# Patient Record
Sex: Female | Born: 1980 | Race: Black or African American | Hispanic: No | Marital: Single | Smoking: Former smoker
Health system: Southern US, Community
[De-identification: ages and names within clinical notes are randomized; demographics above are authoritative.]

## PROBLEM LIST (undated history)

## (undated) ENCOUNTER — Inpatient Hospital Stay (HOSPITAL_COMMUNITY): Payer: Self-pay

## (undated) DIAGNOSIS — T4145XA Adverse effect of unspecified anesthetic, initial encounter: Secondary | ICD-10-CM

## (undated) DIAGNOSIS — F32A Depression, unspecified: Secondary | ICD-10-CM

## (undated) DIAGNOSIS — Z9889 Other specified postprocedural states: Secondary | ICD-10-CM

## (undated) DIAGNOSIS — IMO0002 Reserved for concepts with insufficient information to code with codable children: Secondary | ICD-10-CM

## (undated) DIAGNOSIS — K219 Gastro-esophageal reflux disease without esophagitis: Secondary | ICD-10-CM

## (undated) DIAGNOSIS — F329 Major depressive disorder, single episode, unspecified: Secondary | ICD-10-CM

## (undated) DIAGNOSIS — E669 Obesity, unspecified: Secondary | ICD-10-CM

## (undated) DIAGNOSIS — O139 Gestational [pregnancy-induced] hypertension without significant proteinuria, unspecified trimester: Secondary | ICD-10-CM

## (undated) DIAGNOSIS — R112 Nausea with vomiting, unspecified: Secondary | ICD-10-CM

## (undated) DIAGNOSIS — T8859XA Other complications of anesthesia, initial encounter: Secondary | ICD-10-CM

## (undated) DIAGNOSIS — A749 Chlamydial infection, unspecified: Secondary | ICD-10-CM

## (undated) HISTORY — DX: Chlamydial infection, unspecified: A74.9

## (undated) HISTORY — DX: Reserved for concepts with insufficient information to code with codable children: IMO0002

---

## 2001-11-23 ENCOUNTER — Inpatient Hospital Stay (HOSPITAL_COMMUNITY): Admission: AD | Admit: 2001-11-23 | Discharge: 2001-11-23 | Payer: Self-pay | Admitting: *Deleted

## 2001-12-07 ENCOUNTER — Ambulatory Visit (HOSPITAL_COMMUNITY): Admission: RE | Admit: 2001-12-07 | Discharge: 2001-12-07 | Payer: Self-pay | Admitting: *Deleted

## 2002-02-09 ENCOUNTER — Ambulatory Visit (HOSPITAL_COMMUNITY): Admission: RE | Admit: 2002-02-09 | Discharge: 2002-02-09 | Payer: Self-pay | Admitting: *Deleted

## 2002-03-14 ENCOUNTER — Ambulatory Visit (HOSPITAL_COMMUNITY): Admission: RE | Admit: 2002-03-14 | Discharge: 2002-03-14 | Payer: Self-pay | Admitting: *Deleted

## 2002-04-15 ENCOUNTER — Ambulatory Visit (HOSPITAL_COMMUNITY): Admission: RE | Admit: 2002-04-15 | Discharge: 2002-04-15 | Payer: Self-pay | Admitting: *Deleted

## 2002-05-01 ENCOUNTER — Inpatient Hospital Stay (HOSPITAL_COMMUNITY): Admission: AD | Admit: 2002-05-01 | Discharge: 2002-05-05 | Payer: Self-pay | Admitting: *Deleted

## 2002-05-11 ENCOUNTER — Inpatient Hospital Stay (HOSPITAL_COMMUNITY): Admission: AD | Admit: 2002-05-11 | Discharge: 2002-05-11 | Payer: Self-pay | Admitting: Obstetrics and Gynecology

## 2004-09-22 DIAGNOSIS — IMO0002 Reserved for concepts with insufficient information to code with codable children: Secondary | ICD-10-CM

## 2004-09-22 DIAGNOSIS — R87619 Unspecified abnormal cytological findings in specimens from cervix uteri: Secondary | ICD-10-CM

## 2004-09-22 HISTORY — DX: Unspecified abnormal cytological findings in specimens from cervix uteri: R87.619

## 2004-09-22 HISTORY — DX: Reserved for concepts with insufficient information to code with codable children: IMO0002

## 2005-02-06 ENCOUNTER — Encounter: Admission: RE | Admit: 2005-02-06 | Discharge: 2005-05-07 | Payer: Self-pay | Admitting: *Deleted

## 2007-06-04 ENCOUNTER — Inpatient Hospital Stay (HOSPITAL_COMMUNITY): Admission: AD | Admit: 2007-06-04 | Discharge: 2007-06-04 | Payer: Self-pay | Admitting: Obstetrics & Gynecology

## 2007-06-11 ENCOUNTER — Inpatient Hospital Stay (HOSPITAL_COMMUNITY): Admission: AD | Admit: 2007-06-11 | Discharge: 2007-06-11 | Payer: Self-pay | Admitting: Obstetrics and Gynecology

## 2007-07-30 ENCOUNTER — Ambulatory Visit (HOSPITAL_COMMUNITY): Admission: RE | Admit: 2007-07-30 | Discharge: 2007-07-30 | Payer: Self-pay | Admitting: Family Medicine

## 2007-09-15 ENCOUNTER — Inpatient Hospital Stay (HOSPITAL_COMMUNITY): Admission: AD | Admit: 2007-09-15 | Discharge: 2007-09-15 | Payer: Self-pay | Admitting: Obstetrics and Gynecology

## 2007-10-08 ENCOUNTER — Ambulatory Visit (HOSPITAL_COMMUNITY): Admission: RE | Admit: 2007-10-08 | Discharge: 2007-10-08 | Payer: Self-pay | Admitting: Obstetrics & Gynecology

## 2007-11-11 ENCOUNTER — Inpatient Hospital Stay (HOSPITAL_COMMUNITY): Admission: AD | Admit: 2007-11-11 | Discharge: 2007-11-11 | Payer: Self-pay | Admitting: Obstetrics & Gynecology

## 2007-11-11 ENCOUNTER — Ambulatory Visit: Payer: Self-pay | Admitting: Obstetrics & Gynecology

## 2007-12-21 ENCOUNTER — Inpatient Hospital Stay (HOSPITAL_COMMUNITY): Admission: AD | Admit: 2007-12-21 | Discharge: 2007-12-24 | Payer: Self-pay | Admitting: Obstetrics & Gynecology

## 2007-12-21 ENCOUNTER — Ambulatory Visit: Payer: Self-pay | Admitting: Obstetrics & Gynecology

## 2007-12-30 ENCOUNTER — Ambulatory Visit: Payer: Self-pay | Admitting: *Deleted

## 2008-01-24 ENCOUNTER — Ambulatory Visit: Payer: Self-pay | Admitting: *Deleted

## 2008-01-24 ENCOUNTER — Inpatient Hospital Stay (HOSPITAL_COMMUNITY): Admission: AD | Admit: 2008-01-24 | Discharge: 2008-01-24 | Payer: Self-pay | Admitting: Family Medicine

## 2008-08-14 ENCOUNTER — Emergency Department (HOSPITAL_COMMUNITY): Admission: EM | Admit: 2008-08-14 | Discharge: 2008-08-14 | Payer: Self-pay | Admitting: Emergency Medicine

## 2009-11-12 ENCOUNTER — Emergency Department (HOSPITAL_COMMUNITY): Admission: EM | Admit: 2009-11-12 | Discharge: 2009-11-12 | Payer: Self-pay | Admitting: Emergency Medicine

## 2009-12-18 ENCOUNTER — Emergency Department (HOSPITAL_COMMUNITY): Admission: EM | Admit: 2009-12-18 | Discharge: 2009-12-18 | Payer: Self-pay | Admitting: Emergency Medicine

## 2010-01-09 ENCOUNTER — Emergency Department (HOSPITAL_COMMUNITY): Admission: EM | Admit: 2010-01-09 | Discharge: 2010-01-09 | Payer: Self-pay | Admitting: Emergency Medicine

## 2010-12-10 LAB — RAPID STREP SCREEN (MED CTR MEBANE ONLY): Streptococcus, Group A Screen (Direct): POSITIVE — AB

## 2010-12-11 LAB — STREP A DNA PROBE: Group A Strep Probe: NEGATIVE

## 2011-01-08 ENCOUNTER — Emergency Department (HOSPITAL_COMMUNITY)
Admission: EM | Admit: 2011-01-08 | Discharge: 2011-01-08 | Disposition: A | Discharge status: Home / Self Care | Payer: Self-pay | Attending: Emergency Medicine | Admitting: Emergency Medicine

## 2011-01-08 DIAGNOSIS — R000 Tachycardia, unspecified: Secondary | ICD-10-CM | POA: Insufficient documentation

## 2011-01-08 DIAGNOSIS — R05 Cough: Secondary | ICD-10-CM | POA: Insufficient documentation

## 2011-01-08 DIAGNOSIS — R059 Cough, unspecified: Secondary | ICD-10-CM | POA: Insufficient documentation

## 2011-01-08 DIAGNOSIS — J029 Acute pharyngitis, unspecified: Secondary | ICD-10-CM | POA: Insufficient documentation

## 2011-01-08 LAB — RAPID STREP SCREEN (MED CTR MEBANE ONLY): Streptococcus, Group A Screen (Direct): NEGATIVE

## 2011-02-04 NOTE — Discharge Summary (Signed)
NAME:  Sarah Nash, Sarah Nash                 ACCOUNT NO.:  [id number]   MEDICAL RECORD NO.:  [id number]          PATIENT TYPE:  INP   LOCATION:  9121                          FACILITY:  [facility]   PHYSICIAN:  Phil D. Okey Dupre, M.D.     DATE OF BIRTH:  1981-04-28   DATE OF ADMISSION:  12/21/2007  DATE OF DISCHARGE:  12/24/2007                               DISCHARGE SUMMARY   DISCHARGE DIAGNOSIS:  Repeat low-transverse cesarean section of viable  fetus at 39 weeks.   DISCHARGE MEDICATIONS:  1. Percocet 1-2 tablets every 6 hours for pain as needed.  2. Colace 100 mg p.o. b.i.d. p.r.n. constipation.  3. Ibuprofen 600 mg p.o. q.6 hours p.r.n. pain.  4. Prenatal vitamins 1 tablet daily.  5. Iron sulfate 325 mg p.o. daily.   BRIEF HOSPITAL COURSE:  This is a 30 year old G2, P2, who delivered a  viable female infant at 56 weeks' gestation by repeat low-transverse  cesarean section on December 21, 2007.  The infant's Apgars were 9 and 9 at  1 and 5 minutes respectively.  The infant weighted 8 pounds 5 ounces.  The patient is blood type B positive with a negative antibody screen,  RPR nonreactive, rubella immune, HBsAg negative, HIV nonreactive, GBS  positive.  On December 22, 2007, her hemoglobin was 9.2 with hematocrit of  27.9.  There were no complications from the cesarean section, or before  or after the surgery.  The patient is discharged to home in stable  medical condition.  The infant is discharged with mother, she elected to  not have a circumcision of the infant at this time.  Routine post  cesarean section discharge instructions were given, and the [facility]      Romero Belling, MD      Phil D. Okey Dupre, M.D.  Electronically Signed    MO/MEDQ  D:  12/24/2007  T:  12/25/2007  Job:  [id number]   cc:   [facility]

## 2011-02-04 NOTE — Op Note (Signed)
NAME:  Sarah Nash, Sarah Nash NO.:  [id number]   MEDICAL RECORD NO.:  [id number]          PATIENT TYPE:  INP   LOCATION:  9121                          [facility]ACILITY:  [facility]   PHYSICIAN:  Lesly Dukes, M.D. DATE OF BIRTH:  05-Jul-1981   DATE OF PROCEDURE:  DATE OF DISCHARGE:                               OPERATIVE REPORT   PREOPERATIVE DIAGNOSES:  1. Intrauterine pregnancy at [redacted] weeks gestation.  2. History of previous cesarean section x1.  3. Group B Streptococcus positive.   POSTOPERATIVE DIAGNOSES:  1. Intrauterine pregnancy at [redacted] weeks gestation.  2. History of previous cesarean section x1.  3. Group B Streptococcus positive.   PROCEDURE:  Repeat low transverse cesarean section.   SURGEON:  Lesly Dukes, MD.   ASSISTANT:  Karlton Lemon, MD.   ANESTHESIA:  Spinal.   FINDINGS:  1. A viable infant female weighing 8 pounds 5 ounces with Apgar's 9 at      one minute and 9 at five minutes in a vertex presentation.  2. Clear amniotic fluid.  3. Normal female pelvic anatomy.  4. A few intraperitoneal adhesions.   ESTIMATED BLOOD LOSS:  700 ml.   DRAINS:  Foley with clear yellow urine.   COMPLICATIONS:  None immediate.   SPECIMENS:  1. Placenta to Labor and Delivery.  2. Cord blood to the Cord [facility].   INDICATION FOR PROCEDURE:  Sarah Nash is a 30 year old, gravida 2, para 1-  0-0-1, at [redacted] weeks gestation with a history of one previous cesarean  section.  She has been counseled about trial of labor after a cesarean  but elects for repeat cesarean section.   DESCRIPTION OF PROCEDURE:  The patient was taken to the operating room  and after obtaining adequate spinal anesthesia was prepped and draped in  the usual sterile manner in the supine position with a left lateral  uterine displacement.  After assuring adequate anesthesia, a  Pfannenstiel skin incision was made using the scalpel.  The incision was  carried down through the subcutaneous tissues using the  scalpel.  The  rectus fascia was nicked in the midline and the incision was extended  laterally in each direction using the Mayo scissors.  The rectus muscles  were dissected free of the fascia using both sharp and blunt dissection.  The rectus muscles were separated sharply.  The parietal peritoneum was  identified, grasped between two hemostats, elevated, and entered under  direct visualization with Metzenbaum scissors.  A few intraperitoneal  omental adhesions were broken down.  At this time, an Alexis wound  retractor was placed to provide good visualization of the  intraperitoneal cavity.  A bladder blade was placed and a reflection of  the visceral peritoneum superior to the bladder was identified,  elevated, and incised using the Metzenbaum scissors, and the incision  was extended laterally.  A bladder flap was created using blunt  dissection and retracted with the bladder blade.  A low transverse  uterine incision was made using the scalpel, and the incision was  extended laterally and superiorly using blunt dissection.  The amniotic  membranes were ruptured with pickups with teeth.  A hand was placed  within the uterine cavity and used to elevate and flex the head of the  infant, which was delivered without difficulty.  The infant was bulb  suctioned after delivery of the head.  A nuchal cord x1 was noted and  reduced after delivery of the head.  The body of the infant was then  delivered without difficulty.  The cord was doubly clamped and cut, and  the infant handed to the nursery team in attendance.  A specimen was not  collected for cord blood as this was to be done by the Cord [facility] staff.  The placenta was delivered with cord traction and uterine massage.  The  uterus was left in situ, and the endometrial cavity was wiped free of  any traces of membrane using wet laparotomy sponges.  The edges of the  uterine incision were grasped with ring clamps and the uterine incision   was closed in one layer of running locking #0 Vicryl stitch.  The  abdominal cavity and operative site were then irrigated with copious  amounts of normal saline.  The uterine incision was inspected once again  and found to have good hemostasis.  The rectus muscles were then  inspected, and small areas of bleeding were controlled using the Bovie  cautery.  The rectus fascia was reapproximated with one suture of #0  Vicryl.  Small areas of bleeding in the subcutaneous tissue were  controlled using the Bovie cautery.  The skin was reapproximated with  stainless steel skin staples.  Sponge, needle, and instrument counts  were correct x3.  The patient tolerated the procedure well and went to  the postanesthesia care unit in stable condition.      Karlton Lemon, MD  Electronically Signed     ______________________________  Lesly Dukes, M.D.    NS/MEDQ  D:  12/21/2007  T:  12/21/2007  Job:  [id number]

## 2011-02-07 NOTE — Discharge Summary (Signed)
NAME:  Sarah Nash, Sarah Nash                           ACCOUNT NO.:  [id number]   MEDICAL RECORD NO.:  [id number]                   PATIENT TYPE:  INP   LOCATION:  9124                                 [facility]ILITY[facility]  [facility]   PHYSICIAN:  Billey Gosling, MD                   DATE OF BIRTH:  [birth year]   DATE OF ADMISSION:  05/01/2002  DATE OF DISCHARGE:  05/05/2002                                 DISCHARGE SUMMARY   ADMISSION DIAGNOSES:  1. Intrauterine pregnancy at 40 weeks and two days.  2. Fetal heart rate reactive with mild variables.  3. Unknown group B streptococcus.   DISCHARGE DIAGNOSES:  1. Status post  low transverse cesarean section secondary to arrest of     labor.  2. Anemia.   PROCEDURE:  Low transverse cesarean section.   HOSPITAL COURSE:  A 30 year old  African-American female G-1, P-0 presented  to [facility] at 40 weeks and 2 days gestational age with complaints  that her water broke. The patient was having contractions at this time and  speculum exam revealed clear fluid and positive ferning. Digital cervical  exam revealed the patient was at 1 cm 80% effaced, -2 station and the infant  was vertex. The patient was admitted for Pitocin augmentation and was  started on Unasyn 3 grams IV q.6h. for group B streptococcus prophylaxis.   On hospital day #2, the patient had a temperature spike to 100.4 and was  started on gentamycin and clindamycin for suspected endometritis.   On day #3 of admission, hemoglobin was noted to go from 11.2 on admission to  7.9 on May 03, 2002. The patient was asymptomatic at the time and heart  rate normal at 82. The patient started on ferrous sulfate 325 mg b.i.d.   CBC repeated on 05-04-02 and hemoglobin remained stable at 7.9.   The patient  remained afebrile throughout the remainder of her  hospitalization and was discharged home on May 05, 2002 with Depo-Provera  150 mg IM times one prior to discharge and instructed to repeat that  in 12  weeks at [facility].   DISPOSITION:  Patient stable.   ACTIVITY:  Pelvic rest.   DIET:  Regular.   MEDICATIONS:  1. Percocet 5/325 mg q.4h. p.r.n. pain.  2. Ferrous sulfate 325 mg one p.o. b.i.d.  3. Colace 100 mg one to two p.o. q.h.s.   FOLLOW UP:  The patient  is to follow up at [facility] in six weeks and  also the patient  instructed to return to the MAU at [facility] in two  days for staple removal.                                               Marchelle Folks  Rob Bunting, MD    AS/MEDQ  D:  05/05/2002  T:  05/05/2002  Job:  [id number]

## 2011-02-07 NOTE — Op Note (Signed)
NAME:  Sarah Nash, Sarah Nash                           ACCOUNT NO.:  [id number]   MEDICAL RECORD NO.:  [id number]                   PATIENT TYPE:  INP   LOCATION:  9124                                 [facility]ILITY[facility]   PHYSICIAN:  Enid Cutter, M.D.                  DATE OF BIRTH:  [birth year]   DATE OF PROCEDURE:  05/02/2002  DATE OF DISCHARGE:                                 OPERATIVE REPORT   PREOPERATIVE DIAGNOSIS:  [age]-year-old G1, P0 at 41 weeks with arrest  of dilatation.   POSTOPERATIVE DIAGNOSIS:  [age]-year-old G1, P0 at 41 weeks with arrest  of dilatation.   PROCEDURE:  Primary low transverse cesarean section.   SURGEON:  Enid Cutter, M.D.   ASSISTANT:  Billey Gosling, M.D.   ANESTHESIA:  Epidural.   COMPLICATIONS:  None.   ESTIMATED BLOOD LOSS:  800 cc.   DISPOSITION:  To recovery room stable.   FINDINGS:  Viable female infant delivered at 12:06 a.m. with Apgars of 7 and  9, weight 7 pounds and 1 ounce.  Sponge, needle and instrument counts were  correct at the end of the procedure.   INDICATIONS FOR PROCEDURE:  The patient is a 30 year old gravida 1, para 1  at term who presented with spontaneous rupture of membranes early on the  morning of May 01, 2002.  She was begun on Pitocin augmentation and  maternal monitors.  The patient progressed to 5 cm with adequate labor,  however, failed to dilate beyond that.  Cesarean section was discussed with  the patient including the risks of bleeding, infection, injury to internal  organs, risks of transfusion, and emergent hysterectomy.  The patient  understands and wishes to proceed.   DESCRIPTION OF PROCEDURE:  The patient was taken to the operating room where  her general anesthesia was bolused.  She was prepped and draped in the usual  sterile fashion.  A Pfannenstiel incision was performed with the scalpel and  then carried down to the underlying fascia.  The subcutaneous tissues were  dissected and the  fascia was entered sharply.  The fascia was dissected  laterally with Mayo scissors.  The fascia was separated from the underlying  rectus muscle bellies with sharp and blunt dissection.  The rectus muscle  bellies were separated and the underlying peritoneum was entered sharply.  The peritoneum was dissected posteriorly and inferiorly.  The bladder blade  was placed and bladder flap was created with sharp and blunt dissection.  The uterus was scored with a scalpel, and the uterine incision was carried  down to the midline.  The surgeon's finger was then used to extend this  incision laterally and superiorly.  The infant's head was grasped and  delivered through the uterine incision.  Vicodin was applied to the scalp to  assist with delivery of the head.  The vacuum was removed, and the infant's  body was delivered without difficulty.  The cord was clamped and cut, and  the infant was handed off to the awaiting neonatal resuscitation team.  The  placenta was manually extracted and the uterus was externalized.  The uterus  was curetted with a dry lap sponge.  The uterine incision was repaired with  a running locking stitch of 0 chromic.  The uterine incision was hemostatic,  and the uterus was returned to the abdominal cavity.   The uterus was cleared of all clot and debris.  The uterine incision was  inspected again and noted to be hemostatic.  The fascia was then closed with  a running suture of 0 Vicryl.  The subcutaneous tissues were irrigated with  copious amounts of saline and the subcutaneous tissues were reapproximated  with staples.  The patient tolerated the procedure well.  Sponge, instrument  and needle counts were correct at the end of the procedure.                                               Enid Cutter, M.D.    EMH/MEDQ  D:  05/02/2002  T:  05/03/2002  Job:  [id number]

## 2011-06-16 LAB — CBC
HCT: 32.5 — ABNORMAL LOW
MCV: 79.9
Platelets: 273
RDW: 14.3

## 2011-06-16 LAB — RPR: RPR Ser Ql: NONREACTIVE

## 2011-06-17 LAB — CBC
MCHC: 33
MCV: 80.4
Platelets: 211
WBC: 10.9 — ABNORMAL HIGH

## 2011-06-18 LAB — CBC
Platelets: 286
WBC: 10

## 2011-06-18 LAB — POCT PREGNANCY, URINE: Preg Test, Ur: NEGATIVE

## 2011-06-24 LAB — RAPID STREP SCREEN (MED CTR MEBANE ONLY): Streptococcus, Group A Screen (Direct): NEGATIVE

## 2011-06-27 LAB — URINE CULTURE
Colony Count: NO GROWTH
Culture: NO GROWTH

## 2011-06-27 LAB — WET PREP, GENITAL

## 2011-06-27 LAB — URINALYSIS, ROUTINE W REFLEX MICROSCOPIC
Ketones, ur: NEGATIVE
Leukocytes, UA: NEGATIVE
Nitrite: NEGATIVE
Protein, ur: 30 — AB
Urobilinogen, UA: 0.2

## 2011-07-04 LAB — URINALYSIS, ROUTINE W REFLEX MICROSCOPIC
Bilirubin Urine: NEGATIVE
Hgb urine dipstick: NEGATIVE
Ketones, ur: NEGATIVE
Nitrite: NEGATIVE
Specific Gravity, Urine: 1.02

## 2011-07-04 LAB — POCT PREGNANCY, URINE: Preg Test, Ur: POSITIVE

## 2011-08-16 ENCOUNTER — Inpatient Hospital Stay (HOSPITAL_COMMUNITY)
Admission: AD | Admit: 2011-08-16 | Discharge: 2011-08-16 | Disposition: A | Discharge status: Home / Self Care | Payer: Self-pay | Source: Ambulatory Visit | Attending: Obstetrics and Gynecology | Admitting: Obstetrics and Gynecology

## 2011-08-16 DIAGNOSIS — R55 Syncope and collapse: Secondary | ICD-10-CM | POA: Insufficient documentation

## 2011-08-16 DIAGNOSIS — K589 Irritable bowel syndrome without diarrhea: Secondary | ICD-10-CM

## 2011-08-16 LAB — URINALYSIS, ROUTINE W REFLEX MICROSCOPIC
Glucose, UA: NEGATIVE mg/dL
Hgb urine dipstick: NEGATIVE
Ketones, ur: 15 mg/dL — AB
Protein, ur: NEGATIVE mg/dL
Urobilinogen, UA: 0.2 mg/dL (ref 0.0–1.0)

## 2011-08-16 LAB — POCT PREGNANCY, URINE: Preg Test, Ur: NEGATIVE

## 2011-08-16 LAB — GLUCOSE, CAPILLARY

## 2011-08-16 LAB — URINE MICROSCOPIC-ADD ON

## 2011-08-16 MED ORDER — NORGESTIMATE-ETH ESTRADIOL 0.25-35 MG-MCG PO TABS: 1.0000 | ORAL_TABLET | Freq: Every day | ORAL | Status: DC

## 2011-08-16 NOTE — Progress Notes (Signed)
Passed out yesterday was sitting at a table and fell into the floor, did not hit her head was witnessed by her friend, today feels tired and light headed, LMP 07/17/11, right knee pain has been there but worse today.

## 2011-12-22 DIAGNOSIS — A749 Chlamydial infection, unspecified: Secondary | ICD-10-CM

## 2011-12-22 HISTORY — DX: Chlamydial infection, unspecified: A74.9

## 2011-12-26 ENCOUNTER — Inpatient Hospital Stay (HOSPITAL_COMMUNITY)
Admission: AD | Admit: 2011-12-26 | Discharge: 2011-12-26 | Disposition: A | Discharge status: Home / Self Care | Payer: Medicaid Other | Source: Ambulatory Visit | Attending: Obstetrics and Gynecology | Admitting: Obstetrics and Gynecology

## 2011-12-26 ENCOUNTER — Encounter (HOSPITAL_COMMUNITY): Payer: Self-pay | Admitting: *Deleted

## 2011-12-26 ENCOUNTER — Inpatient Hospital Stay (HOSPITAL_COMMUNITY): Payer: Medicaid Other

## 2011-12-26 DIAGNOSIS — R109 Unspecified abdominal pain: Secondary | ICD-10-CM

## 2011-12-26 DIAGNOSIS — R197 Diarrhea, unspecified: Secondary | ICD-10-CM | POA: Insufficient documentation

## 2011-12-26 DIAGNOSIS — O26899 Other specified pregnancy related conditions, unspecified trimester: Secondary | ICD-10-CM

## 2011-12-26 DIAGNOSIS — O99891 Other specified diseases and conditions complicating pregnancy: Secondary | ICD-10-CM | POA: Insufficient documentation

## 2011-12-26 HISTORY — DX: Depression, unspecified: F32.A

## 2011-12-26 HISTORY — DX: Gestational (pregnancy-induced) hypertension without significant proteinuria, unspecified trimester: O13.9

## 2011-12-26 HISTORY — DX: Major depressive disorder, single episode, unspecified: F32.9

## 2011-12-26 LAB — URINALYSIS, ROUTINE W REFLEX MICROSCOPIC
Bilirubin Urine: NEGATIVE
Hgb urine dipstick: NEGATIVE
Ketones, ur: NEGATIVE mg/dL
Nitrite: NEGATIVE
Protein, ur: NEGATIVE mg/dL
Urobilinogen, UA: 1 mg/dL (ref 0.0–1.0)

## 2011-12-26 LAB — CBC
MCH: 24.2 pg — ABNORMAL LOW (ref 26.0–34.0)
MCHC: 30.1 g/dL (ref 30.0–36.0)
MCV: 80.4 fL (ref 78.0–100.0)
Platelets: 287 10*3/uL (ref 150–400)
RBC: 4.18 MIL/uL (ref 3.87–5.11)
RDW: 15.2 % (ref 11.5–15.5)

## 2011-12-26 LAB — WET PREP, GENITAL

## 2011-12-26 LAB — HCG, QUANTITATIVE, PREGNANCY: hCG, Beta Chain, Quant, S: 41894 m[IU]/mL — ABNORMAL HIGH (ref <5)

## 2011-12-26 LAB — POCT PREGNANCY, URINE: Preg Test, Ur: POSITIVE — AB

## 2011-12-26 NOTE — MAU Note (Signed)
Pamelia Hoit CNM in with pt

## 2011-12-26 NOTE — MAU Note (Signed)
Pt also reports onset diarrhea X 4 episodes today after awakening this am with cramping in there low abdomen. She states she has had cramping off and on for one week but this am was worse. She denies nausea or vomiting.

## 2011-12-26 NOTE — Progress Notes (Signed)
Pamelia Hoit NP in to discuss u/s results and d/c plan. Written and verbal d/c instructions given and understanding voiced

## 2011-12-26 NOTE — MAU Note (Signed)
Patient states she has had a positive home pregnancy test. Has been having lower abdominal cramping on and off for about one week, none since this am. Has had sharp shooting pain in her breasts for a couple of months. No bleeding or discharge.

## 2011-12-29 LAB — GC/CHLAMYDIA PROBE AMP, GENITAL: Chlamydia, DNA Probe: POSITIVE — AB

## 2012-02-18 ENCOUNTER — Inpatient Hospital Stay (HOSPITAL_COMMUNITY): Payer: Medicaid Other

## 2012-02-18 ENCOUNTER — Encounter (HOSPITAL_COMMUNITY): Payer: Self-pay

## 2012-02-18 ENCOUNTER — Inpatient Hospital Stay (HOSPITAL_COMMUNITY)
Admission: AD | Admit: 2012-02-18 | Discharge: 2012-02-18 | Disposition: A | Discharge status: Home / Self Care | Payer: Medicaid Other | Source: Ambulatory Visit | Attending: Obstetrics & Gynecology | Admitting: Obstetrics & Gynecology

## 2012-02-18 DIAGNOSIS — N390 Urinary tract infection, site not specified: Secondary | ICD-10-CM | POA: Insufficient documentation

## 2012-02-18 DIAGNOSIS — R1084 Generalized abdominal pain: Secondary | ICD-10-CM

## 2012-02-18 DIAGNOSIS — O2390 Unspecified genitourinary tract infection in pregnancy, unspecified trimester: Secondary | ICD-10-CM | POA: Insufficient documentation

## 2012-02-18 DIAGNOSIS — R109 Unspecified abdominal pain: Secondary | ICD-10-CM | POA: Insufficient documentation

## 2012-02-18 HISTORY — DX: Obesity, unspecified: E66.9

## 2012-02-18 LAB — URINALYSIS, ROUTINE W REFLEX MICROSCOPIC
Ketones, ur: NEGATIVE mg/dL
Nitrite: NEGATIVE
pH: 6 (ref 5.0–8.0)

## 2012-02-18 LAB — URINE MICROSCOPIC-ADD ON

## 2012-02-18 LAB — WET PREP, GENITAL

## 2012-02-18 MED ORDER — NITROFURANTOIN MACROCRYSTAL 100 MG PO CAPS: 100.0000 mg | ORAL_CAPSULE | Freq: Four times a day (QID) | ORAL | Status: AC

## 2012-02-18 MED ORDER — OXYCODONE-ACETAMINOPHEN 5-325 MG PO TABS: 1.0000 | ORAL_TABLET | ORAL | Status: AC | PRN

## 2012-02-18 MED ORDER — LACTATED RINGERS IV BOLUS (SEPSIS): 1000.0000 mL | Freq: Once | INTRAVENOUS | Status: DC

## 2012-02-18 NOTE — MAU Note (Signed)
Patient states she went for her first appointment at [facility] today and there had been mix up and they were unable to see her and referred her to MAU. Patient states she has been having lower abdominal pulling pressure off and on for a couple of weeks. Denies any bleeding, leaking or vaginal discharge. Reports having felt the baby move.

## 2012-02-18 NOTE — MAU Note (Signed)
Patient's OB appointment rescheduled for Wed. 6/5. Feels pulling and pressure in lower abdomen X 2 wks. No discharge or bleeding.

## 2012-02-18 NOTE — MAU Provider Note (Signed)
History     CSN: [id number]  Arrival date & time 02/18/12  1533   First Provider Initiated Contact with Patient 02/18/12 1840      Chief Complaint  Patient presents with  . Abdominal Pain    HPI  20 yr g3 p2002, prior Cesarean x 2.who has had 2 weeks of lowr abd pain below old cesarean scar. She has significant discomfort with standing x 2 weeks, no bleeding or srom.  She has had only slight discomfort with voiding, no frequency,   Past Medical History  Diagnosis Date  . Pregnancy induced hypertension   . Depression   . Obesity     Past Surgical History  Procedure Date  . Cesarean section     Family History  Problem Relation Age of Onset  . Anesthesia problems Neg Hx     History  Substance Use Topics  . Smoking status: Former Smoker    Quit date: 09/27/2011  . Smokeless tobacco: Never Used  . Alcohol Use: No    OB History    Grav Para Term Preterm Abortions TAB SAB Ect Mult Living   3 2 2       2       Review of Systems  Allergies  Review of patient's allergies indicates no known allergies.  Home Medications  No current outpatient prescriptions on file.  BP 133/92  Pulse 107  Temp(Src) 98.6 F (37 C) (Oral)  Resp 20  Ht 5' 10.5" (1.791 m)  Wt 200.853 kg (442 lb 12.8 oz)  BMI 62.64 kg/m2  SpO2 100%  LMP 09/23/2011  Physical Exam .Physical Examination: General appearance - oriented to person, place, and time, overweight, in mild to moderate distress, crying and only uncomfortable when standing, relieved by supine position  Mental status - alert, oriented to person, place, and time, normal mood, behavior, speech, dress, motor activity, and thought processes Neck - supple, no significant adenopathy Chest - clear to auscultation, no wheezes, rales or rhonchi, symmetric air entry, no tachypnea, retractions or cyanosis Heart - normal rate and regular rhythm Abdomen - soft, nontender, nondistended, no masses or organomegaly Exam limited by morbid  obesity. 2 well healed scars in pfannenstiel area. Discomfort is below this. Pelvic - VULVA: normal appearing vulva with no masses, tenderness or lesions, VAGINA: normal appearing vagina with normal color and discharge, no lesions, vaginal tenderness in the anterior area, , CERVIX: normal appearing cervix without discharge or lesions, difficult to assess due to anatomy, tender to palpation equal to anterior wall , will u/s cervix, exam chaperoned by RN,  Extremities - peripheral pulses normal, no pedal edema, no clubbing or cyanosis, Homan's sign negative bilaterally MAU Course  Procedures (including critical care time)  Labs Reviewed  URINALYSIS, ROUTINE W REFLEX MICROSCOPIC - Abnormal; Notable for the following:    APPearance HAZY (*)    Specific Gravity, Urine >1.030 (*)    Leukocytes, UA TRACE (*)    All other components within normal limits  URINE MICROSCOPIC-ADD ON - Abnormal; Notable for the following:    Squamous Epithelial / LPF FEW (*)    Bacteria, UA FEW (*)    All other components within normal limits   No results found.  U/s of cervix shows no funneling,and pt is nontender when supinel. No diagnosis found.    MDM  IMp : Stretching pain in area of prior cesareans.           Possible uti. Plan:  Abd binder prn when ambulating  Rx Macrobid 100 bid x 7 days           F/u urine c&s           F/u prn.

## 2012-02-19 LAB — GC/CHLAMYDIA PROBE AMP, GENITAL: GC Probe Amp, Genital: NEGATIVE

## 2012-02-25 ENCOUNTER — Other Ambulatory Visit (HOSPITAL_COMMUNITY): Payer: Self-pay | Admitting: Physician Assistant

## 2012-02-25 DIAGNOSIS — Z3689 Encounter for other specified antenatal screening: Secondary | ICD-10-CM

## 2012-02-25 LAB — OB RESULTS CONSOLE HGB/HCT, BLOOD
HCT: 29 %
Hemoglobin: 9 g/dL

## 2012-02-25 LAB — OB RESULTS CONSOLE HIV ANTIBODY (ROUTINE TESTING): HIV: NONREACTIVE

## 2012-02-25 LAB — OB RESULTS CONSOLE GC/CHLAMYDIA
Chlamydia: NEGATIVE
Gonorrhea: NEGATIVE

## 2012-02-25 LAB — OB RESULTS CONSOLE ANTIBODY SCREEN: Antibody Screen: NEGATIVE

## 2012-02-25 LAB — OB RESULTS CONSOLE ABO/RH: RH Type: POSITIVE

## 2012-02-25 LAB — OB RESULTS CONSOLE HEPATITIS B SURFACE ANTIGEN: Hepatitis B Surface Ag: NEGATIVE

## 2012-02-25 LAB — GLUCOSE TOLERANCE, 1 HOUR: Glucose, 1 Hour GTT: 89

## 2012-02-25 LAB — OB RESULTS CONSOLE RPR: RPR: NONREACTIVE

## 2012-02-25 LAB — CULTURE, OB URINE: Urine Culture, OB: NEGATIVE

## 2012-02-26 ENCOUNTER — Other Ambulatory Visit: Payer: Self-pay | Admitting: Physician Assistant

## 2012-02-26 DIAGNOSIS — R809 Proteinuria, unspecified: Secondary | ICD-10-CM

## 2012-03-01 ENCOUNTER — Encounter: Payer: Self-pay | Admitting: Family Medicine

## 2012-03-01 ENCOUNTER — Ambulatory Visit (INDEPENDENT_AMBULATORY_CARE_PROVIDER_SITE_OTHER): Payer: Medicaid Other | Admitting: Family Medicine

## 2012-03-01 VITALS — BP 139/87 | Temp 98.2°F | Wt >= 6400 oz

## 2012-03-01 DIAGNOSIS — E669 Obesity, unspecified: Secondary | ICD-10-CM

## 2012-03-01 DIAGNOSIS — O99210 Obesity complicating pregnancy, unspecified trimester: Secondary | ICD-10-CM

## 2012-03-01 DIAGNOSIS — O10019 Pre-existing essential hypertension complicating pregnancy, unspecified trimester: Secondary | ICD-10-CM | POA: Insufficient documentation

## 2012-03-01 DIAGNOSIS — O34219 Maternal care for unspecified type scar from previous cesarean delivery: Secondary | ICD-10-CM

## 2012-03-01 DIAGNOSIS — O0990 Supervision of high risk pregnancy, unspecified, unspecified trimester: Secondary | ICD-10-CM | POA: Insufficient documentation

## 2012-03-01 LAB — POCT URINALYSIS DIP (DEVICE)
Glucose, UA: NEGATIVE mg/dL
Nitrite: NEGATIVE
Protein, ur: 30 mg/dL — AB
Specific Gravity, Urine: 1.025 (ref 1.005–1.030)
Urobilinogen, UA: 0.2 mg/dL (ref 0.0–1.0)

## 2012-03-01 NOTE — Progress Notes (Signed)
Has pelvic pressure. Pulse: 94 Needs to see Nutrition and SW

## 2012-03-01 NOTE — Patient Instructions (Signed)

## 2012-03-01 NOTE — Progress Notes (Signed)
Has had baseline labs-need results--being faxed. Has anatomy scheduled.

## 2012-03-02 ENCOUNTER — Ambulatory Visit (HOSPITAL_COMMUNITY)
Admission: RE | Admit: 2012-03-02 | Discharge: 2012-03-02 | Disposition: A | Discharge status: Home / Self Care | Payer: Medicaid Other | Source: Ambulatory Visit | Attending: Physician Assistant | Admitting: Physician Assistant

## 2012-03-02 DIAGNOSIS — O99210 Obesity complicating pregnancy, unspecified trimester: Secondary | ICD-10-CM

## 2012-03-02 DIAGNOSIS — O34219 Maternal care for unspecified type scar from previous cesarean delivery: Secondary | ICD-10-CM | POA: Insufficient documentation

## 2012-03-02 DIAGNOSIS — O10019 Pre-existing essential hypertension complicating pregnancy, unspecified trimester: Secondary | ICD-10-CM

## 2012-03-02 DIAGNOSIS — E669 Obesity, unspecified: Secondary | ICD-10-CM | POA: Insufficient documentation

## 2012-03-02 DIAGNOSIS — Z3689 Encounter for other specified antenatal screening: Secondary | ICD-10-CM

## 2012-03-02 DIAGNOSIS — O358XX0 Maternal care for other (suspected) fetal abnormality and damage, not applicable or unspecified: Secondary | ICD-10-CM | POA: Insufficient documentation

## 2012-03-02 DIAGNOSIS — O09299 Supervision of pregnancy with other poor reproductive or obstetric history, unspecified trimester: Secondary | ICD-10-CM | POA: Insufficient documentation

## 2012-03-02 DIAGNOSIS — O0990 Supervision of high risk pregnancy, unspecified, unspecified trimester: Secondary | ICD-10-CM

## 2012-03-04 ENCOUNTER — Encounter: Payer: Self-pay | Admitting: *Deleted

## 2012-03-15 ENCOUNTER — Ambulatory Visit (INDEPENDENT_AMBULATORY_CARE_PROVIDER_SITE_OTHER): Payer: Medicaid Other | Admitting: Family Medicine

## 2012-03-15 VITALS — BP 144/92 | Temp 98.2°F | Wt >= 6400 oz

## 2012-03-15 DIAGNOSIS — O10019 Pre-existing essential hypertension complicating pregnancy, unspecified trimester: Secondary | ICD-10-CM

## 2012-03-15 NOTE — Patient Instructions (Signed)
Pregnancy - Second Trimester The second trimester of pregnancy (3 to 6 months) is a period of rapid growth for you and your baby. At the end of the sixth month, your baby is about 9 inches long and weighs 1 1/2 pounds. You will begin to feel the baby move between 18 and 20 weeks of the pregnancy. This is called quickening. Weight gain is faster. A clear fluid (colostrum) may leak out of your breasts. You may feel small contractions of the womb (uterus). This is known as false labor or Braxton-Hicks contractions. This is like a practice for labor when the baby is ready to be born. Usually, the problems with morning sickness have usually passed by the end of your first trimester. Some women develop small dark blotches (called cholasma, mask of pregnancy) on their face that usually goes away after the baby is born. Exposure to the sun makes the blotches worse. Acne may also develop in some pregnant women and pregnant women who have acne, may find that it goes away. PRENATAL EXAMS  Blood work may continue to be done during prenatal exams. These tests are done to check on your health and the probable health of your baby. Blood work is used to follow your blood levels (hemoglobin). Anemia (low hemoglobin) is common during pregnancy. Iron and vitamins are given to help prevent this. You will also be checked for diabetes between 24 and 28 weeks of the pregnancy. Some of the previous blood tests may be repeated.   The size of the uterus is measured during each visit. This is to make sure that the baby is continuing to grow properly according to the dates of the pregnancy.   Your blood pressure is checked every prenatal visit. This is to make sure you are not getting toxemia.   Your urine is checked to make sure you do not have an infection, diabetes or protein in the urine.   Your weight is checked often to make sure gains are happening at the suggested rate. This is to ensure that both you and your baby are  growing normally.   Sometimes, an ultrasound is performed to confirm the proper growth and development of the baby. This is a test which bounces harmless sound waves off the baby so your caregiver can more accurately determine due dates.  Sometimes, a specialized test is done on the amniotic fluid surrounding the baby. This test is called an amniocentesis. The amniotic fluid is obtained by sticking a needle into the belly (abdomen). This is done to check the chromosomes in instances where there is a concern about possible genetic problems with the baby. It is also sometimes done near the end of pregnancy if an early delivery is required. In this case, it is done to help make sure the baby's lungs are mature enough for the baby to live outside of the womb. CHANGES OCCURING IN THE SECOND TRIMESTER OF PREGNANCY Your body goes through many changes during pregnancy. They vary from person to person. Talk to your caregiver about changes you notice that you are concerned about.  During the second trimester, you will likely have an increase in your appetite. It is normal to have cravings for certain foods. This varies from person to person and pregnancy to pregnancy.   Your lower abdomen will begin to bulge.   You may have to urinate more often because the uterus and baby are pressing on your bladder. It is also common to get more bladder infections during pregnancy (  pain with urination). You can help this by drinking lots of fluids and emptying your bladder before and after intercourse.   You may begin to get stretch marks on your hips, abdomen, and breasts. These are normal changes in the body during pregnancy. There are no exercises or medications to take that prevent this change.   You may begin to develop swollen and bulging veins (varicose veins) in your legs. Wearing support hose, elevating your feet for 15 minutes, 3 to 4 times a day and limiting salt in your diet helps lessen the problem.    Heartburn may develop as the uterus grows and pushes up against the stomach. Antacids recommended by your caregiver helps with this problem. Also, eating smaller meals 4 to 5 times a day helps.   Constipation can be treated with a stool softener or adding bulk to your diet. Drinking lots of fluids, vegetables, fruits, and whole grains are helpful.   Exercising is also helpful. If you have been very active up until your pregnancy, most of these activities can be continued during your pregnancy. If you have been less active, it is helpful to start an exercise program such as walking.   Hemorrhoids (varicose veins in the rectum) may develop at the end of the second trimester. Warm sitz baths and hemorrhoid cream recommended by your caregiver helps hemorrhoid problems.   Backaches may develop during this time of your pregnancy. Avoid heavy lifting, wear low heal shoes and practice good posture to help with backache problems.   Some pregnant women develop tingling and numbness of their hand and fingers because of swelling and tightening of ligaments in the wrist (carpel tunnel syndrome). This goes away after the baby is born.   As your breasts enlarge, you may have to get a bigger bra. Get a comfortable, cotton, support bra. Do not get a nursing bra until the last month of the pregnancy if you will be nursing the baby.   You may get a dark line from your belly button to the pubic area called the linea nigra.   You may develop rosy cheeks because of increase blood flow to the face.   You may develop spider looking lines of the face, neck, arms and chest. These go away after the baby is born.  HOME CARE INSTRUCTIONS   It is extremely important to avoid all smoking, herbs, alcohol, and unprescribed drugs during your pregnancy. These chemicals affect the formation and growth of the baby. Avoid these chemicals throughout the pregnancy to ensure the delivery of a healthy infant.   Most of your home  care instructions are the same as suggested for the first trimester of your pregnancy. Keep your caregiver's appointments. Follow your caregiver's instructions regarding medication use, exercise and diet.   During pregnancy, you are providing food for you and your baby. Continue to eat regular, well-balanced meals. Choose foods such as meat, fish, milk and other low fat dairy products, vegetables, fruits, and whole-grain breads and cereals. Your caregiver will tell you of the ideal weight gain.   A physical sexual relationship may be continued up until near the end of pregnancy if there are no other problems. Problems could include early (premature) leaking of amniotic fluid from the membranes, vaginal bleeding, abdominal pain, or other medical or pregnancy problems.   Exercise regularly if there are no restrictions. Check with your caregiver if you are unsure of the safety of some of your exercises. The greatest weight gain will occur in the   last 2 trimesters of pregnancy. Exercise will help you:   Control your weight.   Get you in shape for labor and delivery.   Lose weight after you have the baby.   Wear a good support or jogging bra for breast tenderness during pregnancy. This may help if worn during sleep. Pads or tissues may be used in the bra if you are leaking colostrum.   Do not use hot tubs, steam rooms or saunas throughout the pregnancy.   Wear your seat belt at all times when driving. This protects you and your baby if you are in an accident.   Avoid raw meat, uncooked cheese, cat litter boxes and soil used by cats. These carry germs that can cause birth defects in the baby.   The second trimester is also a good time to visit your dentist for your dental health if this has not been done yet. Getting your teeth cleaned is OK. Use a soft toothbrush. Brush gently during pregnancy.   It is easier to loose urine during pregnancy. Tightening up and strengthening the pelvic muscles will  help with this problem. Practice stopping your urination while you are going to the bathroom. These are the same muscles you need to strengthen. It is also the muscles you would use as if you were trying to stop from passing gas. You can practice tightening these muscles up 10 times a set and repeating this about 3 times per day. Once you know what muscles to tighten up, do not perform these exercises during urination. It is more likely to contribute to an infection by backing up the urine.   Ask for help if you have financial, counseling or nutritional needs during pregnancy. Your caregiver will be able to offer counseling for these needs as well as refer you for other special needs.   Your skin may become oily. If so, wash your face with mild soap, use non-greasy moisturizer and oil or cream based makeup.  MEDICATIONS AND DRUG USE IN PREGNANCY  Take prenatal vitamins as directed. The vitamin should contain 1 milligram of folic acid. Keep all vitamins out of reach of children. Only a couple vitamins or tablets containing iron may be fatal to a baby or young child when ingested.   Avoid use of all medications, including herbs, over-the-counter medications, not prescribed or suggested by your caregiver. Only take over-the-counter or prescription medicines for pain, discomfort, or fever as directed by your caregiver. Do not use aspirin.   Let your caregiver also know about herbs you may be using.   Alcohol is related to a number of birth defects. This includes fetal alcohol syndrome. All alcohol, in any form, should be avoided completely. Smoking will cause low birth rate and premature babies.   Street or illegal drugs are very harmful to the baby. They are absolutely forbidden. A baby born to an addicted mother will be addicted at birth. The baby will go through the same withdrawal an adult does.  SEEK MEDICAL CARE IF:  You have any concerns or worries during your pregnancy. It is better to call with  your questions if you feel they cannot wait, rather than worry about them. SEEK IMMEDIATE MEDICAL CARE IF:   An unexplained oral temperature above 102 F (38.9 C) develops, or as your caregiver suggests.   You have leaking of fluid from the vagina (birth canal). If leaking membranes are suspected, take your temperature and tell your caregiver of this when you call.   There   is vaginal spotting, bleeding, or passing clots. Tell your caregiver of the amount and how many pads are used. Light spotting in pregnancy is common, especially following intercourse.   You develop a bad smelling vaginal discharge with a change in the color from clear to white.   You continue to feel sick to your stomach (nauseated) and have no relief from remedies suggested. You vomit blood or coffee ground-like materials.   You lose more than 2 pounds of weight or gain more than 2 pounds of weight over 1 week, or as suggested by your caregiver.   You notice swelling of your face, hands, feet, or legs.   You get exposed to German measles and have never had them.   You are exposed to fifth disease or chickenpox.   You develop belly (abdominal) pain. Round ligament discomfort is a common non-cancerous (benign) cause of abdominal pain in pregnancy. Your caregiver still must evaluate you.   You develop a bad headache that does not go away.   You develop fever, diarrhea, pain with urination, or shortness of breath.   You develop visual problems, blurry, or double vision.   You fall or are in a car accident or any kind of trauma.   There is mental or physical violence at home.  Document Released: 09/02/2001 Document Revised: 08/28/2011 Document Reviewed: 03/07/2009 [employer] Patient Information 2012 [employer]. Breastfeeding BENEFITS OF BREASTFEEDING For the baby  The first milk (colostrum) helps the baby's digestive system function better.   There are antibodies from the mother in the milk that help the  baby fight off infections.   The baby has a lower incidence of asthma, allergies, and SIDS (sudden infant death syndrome).   The nutrients in breast milk are better than formulas for the baby and helps the baby's brain grow better.   Babies who breastfeed have less gas, colic, and constipation.  For the mother  Breastfeeding helps develop a very special bond between mother and baby.   It is more convenient, always available at the correct temperature and cheaper than formula feeding.   It burns calories in the mother and helps with losing weight that was gained during pregnancy.   It makes the uterus contract back down to normal size faster and slows bleeding following delivery.   Breastfeeding mothers have a lower risk of developing breast cancer.  NURSE FREQUENTLY  A healthy, full-term baby may breastfeed as often as every hour or space his or her feedings to every 3 hours.   How often to nurse will vary from baby to baby. Watch your baby for signs of hunger, not the clock.   Nurse as often as the baby requests, or when you feel the need to reduce the fullness of your breasts.   Awaken the baby if it has been 3 to 4 hours since the last feeding.   Frequent feeding will help the mother make more milk and will prevent problems like sore nipples and engorgement of the breasts.  BABY'S POSITION AT THE BREAST  Whether lying down or sitting, be sure that the baby's tummy is facing your tummy.   Support the breast with 4 fingers underneath the breast and the thumb above. Make sure your fingers are well away from the nipple and baby's mouth.   Stroke the baby's lips and cheek closest to the breast gently with your finger or nipple.   When the baby's mouth is open wide enough, place all of your nipple and as much   of the dark area around the nipple as possible into your baby's mouth.   Pull the baby in close so the tip of the nose and the baby's cheeks touch the breast during the  feeding.  FEEDINGS  The length of each feeding varies from baby to baby and from feeding to feeding.   The baby must suck about 2 to 3 minutes for your milk to get to him or her. This is called a "let down." For this reason, allow the baby to feed on each breast as long as he or she wants. Your baby will end the feeding when he or she has received the right balance of nutrients.   To break the suction, put your finger into the corner of the baby's mouth and slide it between his or her gums before removing your breast from his or her mouth. This will help prevent sore nipples.  REDUCING BREAST ENGORGEMENT  In the first week after your baby is born, you may experience signs of breast engorgement. When breasts are engorged, they feel heavy, warm, full, and may be tender to the touch. You can reduce engorgement if you:   Nurse frequently, every 2 to 3 hours. Mothers who breastfeed early and often have fewer problems with engorgement.   Place light ice packs on your breasts between feedings. This reduces swelling. Wrap the ice packs in a lightweight towel to protect your skin.   Apply moist hot packs to your breast for 5 to 10 minutes before each feeding. This increases circulation and helps the milk flow.   Gently massage your breast before and during the feeding.   Make sure that the baby empties at least one breast at every feeding before switching sides.   Use a breast pump to empty the breasts if your baby is sleepy or not nursing well. You may also want to pump if you are returning to work or or you feel you are getting engorged.   Avoid bottle feeds, pacifiers or supplemental feedings of water or juice in place of breastfeeding.   Be sure the baby is latched on and positioned properly while breastfeeding.   Prevent fatigue, stress, and anemia.   Wear a supportive bra, avoiding underwire styles.   Eat a balanced diet with enough fluids.  If you follow these suggestions, your  engorgement should improve in 24 to 48 hours. If you are still experiencing difficulty, call your lactation consultant or caregiver. IS MY BABY GETTING ENOUGH MILK? Sometimes, mothers worry about whether their babies are getting enough milk. You can be assured that your baby is getting enough milk if:  The baby is actively sucking and you hear swallowing.   The baby nurses at least 8 to 12 times in a 24 hour time period. Nurse your baby until he or she unlatches or falls asleep at the first breast (at least 10 to 20 minutes), then offer the second side.   The baby is wetting 5 to 6 disposable diapers (6 to 8 cloth diapers) in a 24 hour period by 5 to 6 days of age.   The baby is having at least 2 to 3 stools every 24 hours for the first few months. Breast milk is all the food your baby needs. It is not necessary for your baby to have water or formula. In fact, to help your breasts make more milk, it is best not to give your baby supplemental feedings during the early weeks.   The stool should   be soft and yellow.   The baby should gain 4 to 7 ounces per week after he is 4 days old.  TAKE CARE OF YOURSELF Take care of your breasts by:  Bathing or showering daily.   Avoiding the use of soaps on your nipples.   Start feedings on your left breast at one feeding and on your right breast at the next feeding.   You will notice an increase in your milk supply 2 to 5 days after delivery. You may feel some discomfort from engorgement, which makes your breasts very firm and often tender. Engorgement "peaks" out within 24 to 48 hours. In the meantime, apply warm moist towels to your breasts for 5 to 10 minutes before feeding. Gentle massage and expression of some milk before feeding will soften your breasts, making it easier for your baby to latch on. Wear a well fitting nursing bra and air dry your nipples for 10 to 15 minutes after each feeding.   Only use cotton bra pads.   Only use pure lanolin on  your nipples after nursing. You do not need to wash it off before nursing.  Take care of yourself by:   Eating well-balanced meals and nutritious snacks.   Drinking milk, fruit juice, and water to satisfy your thirst (about 8 glasses a day).   Getting plenty of rest.   Increasing calcium in your diet (1200 mg a day).   Avoiding foods that you notice affect the baby in a bad way.  SEEK MEDICAL CARE IF:   You have any questions or difficulty with breastfeeding.   You need help.   You have a hard, red, sore area on your breast, accompanied by a fever of 100.5 F (38.1 C) or more.   Your baby is too sleepy to eat well or is having trouble sleeping.   Your baby is wetting less than 6 diapers per day, by 5 days of age.   Your baby's skin or white part of his or her eyes is more yellow than it was in the hospital.   You feel depressed.  Document Released: 09/08/2005 Document Revised: 08/28/2011 Document Reviewed: 04/23/2009 [employer] Patient Information 2012 [employer]. 

## 2012-03-15 NOTE — Progress Notes (Signed)
Doing well--reports baseline labs being done--will look for results.

## 2012-03-15 NOTE — Progress Notes (Signed)
Pulse- 102 Patient reports pelvic pressure  

## 2012-03-16 LAB — POCT URINALYSIS DIP (DEVICE)
Glucose, UA: NEGATIVE mg/dL
Hgb urine dipstick: NEGATIVE
Nitrite: NEGATIVE
Protein, ur: 100 mg/dL — AB
Urobilinogen, UA: 1 mg/dL (ref 0.0–1.0)
pH: 7 (ref 5.0–8.0)

## 2012-03-17 ENCOUNTER — Other Ambulatory Visit: Payer: Self-pay | Admitting: Family Medicine

## 2012-03-17 LAB — CULTURE, OB URINE

## 2012-03-17 MED ORDER — CEPHALEXIN 500 MG PO CAPS: 500.0000 mg | ORAL_CAPSULE | Freq: Three times a day (TID) | ORAL | Status: AC

## 2012-03-17 NOTE — Progress Notes (Signed)
For E-coli UTI

## 2012-03-17 NOTE — Progress Notes (Signed)
Called patient and left a message to call us back. Patient returned call. Informed her of uti and need to pick up medicine

## 2012-03-25 ENCOUNTER — Inpatient Hospital Stay (HOSPITAL_COMMUNITY)
Admission: AD | Admit: 2012-03-25 | Discharge: 2012-03-25 | Disposition: A | Discharge status: Home / Self Care | Payer: Medicaid Other | Source: Ambulatory Visit | Attending: Obstetrics and Gynecology | Admitting: Obstetrics and Gynecology

## 2012-03-25 ENCOUNTER — Encounter (HOSPITAL_COMMUNITY): Payer: Self-pay | Admitting: *Deleted

## 2012-03-25 DIAGNOSIS — O34219 Maternal care for unspecified type scar from previous cesarean delivery: Secondary | ICD-10-CM

## 2012-03-25 DIAGNOSIS — O99891 Other specified diseases and conditions complicating pregnancy: Secondary | ICD-10-CM | POA: Insufficient documentation

## 2012-03-25 DIAGNOSIS — O0990 Supervision of high risk pregnancy, unspecified, unspecified trimester: Secondary | ICD-10-CM

## 2012-03-25 DIAGNOSIS — O10019 Pre-existing essential hypertension complicating pregnancy, unspecified trimester: Secondary | ICD-10-CM

## 2012-03-25 DIAGNOSIS — R141 Gas pain: Secondary | ICD-10-CM

## 2012-03-25 DIAGNOSIS — O99210 Obesity complicating pregnancy, unspecified trimester: Secondary | ICD-10-CM

## 2012-03-25 LAB — URINALYSIS, ROUTINE W REFLEX MICROSCOPIC
Leukocytes, UA: NEGATIVE
Nitrite: NEGATIVE
Protein, ur: 30 mg/dL — AB
Urobilinogen, UA: 0.2 mg/dL (ref 0.0–1.0)

## 2012-03-25 LAB — URINE MICROSCOPIC-ADD ON

## 2012-03-25 NOTE — MAU Note (Signed)
PT SAYS SHE WOKE AT 0430-  ENTIRE ABD HURTING- WENT TO B-ROOM- HAD BM- NOW PAIN IS UNDER BREAST- ARRIVED VIA EMS.

## 2012-03-25 NOTE — MAU Provider Note (Signed)
  History     CSN: [id number]  Arrival date and time: 03/25/12 4098   First Provider Initiated Contact with Patient 03/25/12 [id number]      No chief complaint on file.  HPI This is a 31 y.o. female at [redacted]w[redacted]d who presented via EMS with c/o one episode of abdominal pain this morning at 0430.  States she called the hospital and they "told me to call an ambulance".  Got up when pain started and went to bathroom. Pain got worse for a minute, then after she had a BM, felt better. States feels almost totally better now. Has been on antibiotics for a UTI for a week. Denies N/V/D or fever. Denies bleeding or contractions.   OB History    Grav Para Term Preterm Abortions TAB SAB Ect Mult Living   3 2 2       2       Past Medical History  Diagnosis Date  . Pregnancy induced hypertension   . Depression   . Obesity   . Abnormal Pap smear 2006  . Chlamydia april 2013    Past Surgical History  Procedure Date  . Cesarean section     Family History  Problem Relation Age of Onset  . Anesthesia problems Neg Hx   . Hypertension Father   . Mental illness Father     History  Substance Use Topics  . Smoking status: Former Smoker    Quit date: 09/27/2011  . Smokeless tobacco: Never Used  . Alcohol Use: No    Allergies: No Known Allergies  Prescriptions prior to admission  Medication Sig Dispense Refill  . cephALEXin (KEFLEX) 500 MG capsule Take 1 capsule (500 mg total) by mouth 3 (three) times daily.  21 capsule  0  . Prenatal Vit-Fe Fumarate-FA (MULTIVITAMIN-PRENATAL) 27-0.8 MG TABS Take 1 tablet by mouth daily.      Marland Kitchen oxyCODONE-acetaminophen (PERCOCET) 5-325 MG per tablet Take 1 tablet by mouth every 4 (four) hours as needed.        ROS As listed in HPI.  Physical Exam   Blood pressure 136/90, pulse 93, temperature 98.5 F (36.9 C), temperature source Oral, resp. rate 20, height 5\' 11"  (1.803 m), weight 435 lb 6 oz (197.485 kg), last menstrual period 09/23/2011.  Physical Exam    Constitutional: She is oriented to person, place, and time. She appears well-developed and well-nourished.  HENT:  Head: Normocephalic.  Cardiovascular: Normal rate.   Respiratory: Effort normal.  GI: Soft. She exhibits no distension and no mass. There is no tenderness. There is no rebound and no guarding.  Musculoskeletal: Normal range of motion.  Neurological: She is alert and oriented to person, place, and time.  Skin: Skin is warm and dry.  Psychiatric: She has a normal mood and affect.    MAU Course  Procedures   Assessment and Plan  A:  SIUP at 108w2d       Abdominal pain episode x 1, now resolved   P:  Will recheck Urinalysis       Then will discharge home. Declines GI cocktail now.        Pt states she is afraid to go home because it might happen again. Reassured it is probably gas pain and recommended she take probiotics and/or Activia.        [facility] 03/25/2012, 7:45 AM

## 2012-03-28 NOTE — MAU Provider Note (Signed)
Attestation of Attending Supervision of Advanced Practitioner: Evaluation and management procedures were performed by the PA/NP/CNM/OB Fellow under my supervision/collaboration. Chart reviewed and agree with management and plan.  Rashell Shambaugh V 03/28/2012 11:18 AM

## 2012-03-29 ENCOUNTER — Encounter: Payer: Medicaid Other | Admitting: Physician Assistant

## 2012-04-05 ENCOUNTER — Ambulatory Visit (INDEPENDENT_AMBULATORY_CARE_PROVIDER_SITE_OTHER): Payer: Medicaid Other | Admitting: Physician Assistant

## 2012-04-05 VITALS — BP 140/85 | Temp 98.8°F | Wt >= 6400 oz

## 2012-04-05 DIAGNOSIS — O99210 Obesity complicating pregnancy, unspecified trimester: Secondary | ICD-10-CM

## 2012-04-05 DIAGNOSIS — O10019 Pre-existing essential hypertension complicating pregnancy, unspecified trimester: Secondary | ICD-10-CM

## 2012-04-05 DIAGNOSIS — O0990 Supervision of high risk pregnancy, unspecified, unspecified trimester: Secondary | ICD-10-CM

## 2012-04-05 LAB — POCT URINALYSIS DIP (DEVICE)
Hgb urine dipstick: NEGATIVE
Nitrite: NEGATIVE
Protein, ur: 30 mg/dL — AB
Urobilinogen, UA: 1 mg/dL (ref 0.0–1.0)
pH: 6.5 (ref 5.0–8.0)

## 2012-04-05 LAB — COMPREHENSIVE METABOLIC PANEL
Albumin: 3.1 g/dL — ABNORMAL LOW (ref 3.5–5.2)
Alkaline Phosphatase: 65 U/L (ref 39–117)
BUN: 5 mg/dL — ABNORMAL LOW (ref 6–23)
Glucose, Bld: 112 mg/dL — ABNORMAL HIGH (ref 70–99)
Potassium: 3.7 mEq/L (ref 3.5–5.3)
Total Bilirubin: 0.2 mg/dL — ABNORMAL LOW (ref 0.3–1.2)

## 2012-04-05 NOTE — Progress Notes (Signed)
+  FM no pre-x s/s. Growth [place] ordered. Unable to locate BL labs. Will recheck cbc, cmet, pro/creat ratio today. Pre-x precautions reviewed. 1 Glucola today.

## 2012-04-05 NOTE — Patient Instructions (Signed)

## 2012-04-05 NOTE — Progress Notes (Signed)
Pulse 93 1hr gtt today due at 1150

## 2012-04-05 NOTE — Progress Notes (Signed)
U/S scheduled April 13, 2012 at 315pm.

## 2012-04-06 LAB — PROTEIN / CREATININE RATIO, URINE
Protein Creatinine Ratio: 0.07 (ref <0.15)
Total Protein, Urine: 19 mg/dL

## 2012-04-13 ENCOUNTER — Ambulatory Visit (HOSPITAL_COMMUNITY)
Admission: RE | Admit: 2012-04-13 | Discharge: 2012-04-13 | Disposition: A | Discharge status: Home / Self Care | Payer: Medicaid Other | Source: Ambulatory Visit | Attending: Physician Assistant | Admitting: Physician Assistant

## 2012-04-13 DIAGNOSIS — O0990 Supervision of high risk pregnancy, unspecified, unspecified trimester: Secondary | ICD-10-CM

## 2012-04-13 DIAGNOSIS — O10019 Pre-existing essential hypertension complicating pregnancy, unspecified trimester: Secondary | ICD-10-CM

## 2012-04-13 DIAGNOSIS — Z363 Encounter for antenatal screening for malformations: Secondary | ICD-10-CM | POA: Insufficient documentation

## 2012-04-13 DIAGNOSIS — O358XX0 Maternal care for other (suspected) fetal abnormality and damage, not applicable or unspecified: Secondary | ICD-10-CM | POA: Insufficient documentation

## 2012-04-13 DIAGNOSIS — O99210 Obesity complicating pregnancy, unspecified trimester: Secondary | ICD-10-CM

## 2012-04-13 DIAGNOSIS — O34219 Maternal care for unspecified type scar from previous cesarean delivery: Secondary | ICD-10-CM | POA: Insufficient documentation

## 2012-04-13 DIAGNOSIS — O139 Gestational [pregnancy-induced] hypertension without significant proteinuria, unspecified trimester: Secondary | ICD-10-CM | POA: Insufficient documentation

## 2012-04-13 DIAGNOSIS — Z1389 Encounter for screening for other disorder: Secondary | ICD-10-CM | POA: Insufficient documentation

## 2012-04-19 ENCOUNTER — Ambulatory Visit (INDEPENDENT_AMBULATORY_CARE_PROVIDER_SITE_OTHER): Payer: Medicaid Other | Admitting: Obstetrics and Gynecology

## 2012-04-19 ENCOUNTER — Encounter: Payer: Self-pay | Admitting: Physician Assistant

## 2012-04-19 VITALS — BP 143/88 | Temp 98.1°F | Wt >= 6400 oz

## 2012-04-19 DIAGNOSIS — E669 Obesity, unspecified: Secondary | ICD-10-CM

## 2012-04-19 DIAGNOSIS — O10019 Pre-existing essential hypertension complicating pregnancy, unspecified trimester: Secondary | ICD-10-CM

## 2012-04-19 DIAGNOSIS — O99210 Obesity complicating pregnancy, unspecified trimester: Secondary | ICD-10-CM

## 2012-04-19 DIAGNOSIS — O0990 Supervision of high risk pregnancy, unspecified, unspecified trimester: Secondary | ICD-10-CM

## 2012-04-19 DIAGNOSIS — O34219 Maternal care for unspecified type scar from previous cesarean delivery: Secondary | ICD-10-CM

## 2012-04-19 LAB — POCT URINALYSIS DIP (DEVICE)
Ketones, ur: NEGATIVE mg/dL
Protein, ur: 30 mg/dL — AB

## 2012-04-19 NOTE — Progress Notes (Signed)
Patient doing well without complaints. FM/PTL precautions reviewed. Patient undecided on birth control- OCP vs Mirena

## 2012-04-19 NOTE — Progress Notes (Signed)
Pulse

## 2012-05-03 ENCOUNTER — Encounter: Payer: Medicaid Other | Admitting: Advanced Practice Midwife

## 2012-05-10 ENCOUNTER — Ambulatory Visit (INDEPENDENT_AMBULATORY_CARE_PROVIDER_SITE_OTHER): Payer: Medicaid Other | Admitting: Obstetrics & Gynecology

## 2012-05-10 VITALS — BP 141/92 | Temp 98.0°F | Wt >= 6400 oz

## 2012-05-10 DIAGNOSIS — O0990 Supervision of high risk pregnancy, unspecified, unspecified trimester: Secondary | ICD-10-CM

## 2012-05-10 DIAGNOSIS — O10019 Pre-existing essential hypertension complicating pregnancy, unspecified trimester: Secondary | ICD-10-CM

## 2012-05-10 LAB — POCT URINALYSIS DIP (DEVICE)
Glucose, UA: NEGATIVE mg/dL
Nitrite: NEGATIVE
Specific Gravity, Urine: 1.025 (ref 1.005–1.030)
Urobilinogen, UA: 1 mg/dL (ref 0.0–1.0)

## 2012-05-10 NOTE — Progress Notes (Signed)
Will start NST twice weekly. Lots of pelvic pressure. Schedule f/u US at 34 weeks.

## 2012-05-10 NOTE — Progress Notes (Signed)
U/S scheduled 05/17/12 at 3 pm.

## 2012-05-10 NOTE — Progress Notes (Signed)
Pulse 86.  2ndBP: 127/82 C/o increasing pelvic pain; pressure in vagina- states feel like a knot in vagina when ambulating. Vaginal d/c stated as thin creamy white; no odor, no itch.

## 2012-05-10 NOTE — Patient Instructions (Signed)

## 2012-05-13 ENCOUNTER — Other Ambulatory Visit: Payer: Medicaid Other

## 2012-05-17 ENCOUNTER — Other Ambulatory Visit: Payer: Medicaid Other

## 2012-05-17 ENCOUNTER — Other Ambulatory Visit: Payer: Self-pay | Admitting: Obstetrics & Gynecology

## 2012-05-17 ENCOUNTER — Encounter: Payer: Self-pay | Admitting: Obstetrics and Gynecology

## 2012-05-17 ENCOUNTER — Ambulatory Visit (HOSPITAL_COMMUNITY): Payer: Medicaid Other

## 2012-05-17 ENCOUNTER — Ambulatory Visit (HOSPITAL_COMMUNITY)
Admission: RE | Admit: 2012-05-17 | Discharge: 2012-05-17 | Disposition: A | Discharge status: Home / Self Care | Payer: Medicaid Other | Source: Ambulatory Visit | Attending: Obstetrics & Gynecology | Admitting: Obstetrics & Gynecology

## 2012-05-17 DIAGNOSIS — O10019 Pre-existing essential hypertension complicating pregnancy, unspecified trimester: Secondary | ICD-10-CM

## 2012-05-17 DIAGNOSIS — E669 Obesity, unspecified: Secondary | ICD-10-CM | POA: Insufficient documentation

## 2012-05-17 DIAGNOSIS — O99210 Obesity complicating pregnancy, unspecified trimester: Secondary | ICD-10-CM | POA: Insufficient documentation

## 2012-05-17 DIAGNOSIS — O09299 Supervision of pregnancy with other poor reproductive or obstetric history, unspecified trimester: Secondary | ICD-10-CM | POA: Insufficient documentation

## 2012-05-17 DIAGNOSIS — O139 Gestational [pregnancy-induced] hypertension without significant proteinuria, unspecified trimester: Secondary | ICD-10-CM | POA: Insufficient documentation

## 2012-05-17 DIAGNOSIS — O34219 Maternal care for unspecified type scar from previous cesarean delivery: Secondary | ICD-10-CM | POA: Insufficient documentation

## 2012-05-27 ENCOUNTER — Telehealth: Payer: Self-pay | Admitting: *Deleted

## 2012-05-27 NOTE — Telephone Encounter (Signed)
Called pt and discussed that she needs to be having twice weekly NST's due to hypertension and has missed appts.  This is important for the health and well-being of her baby and herself.  I asked if pt could come in today by 4pm for NST and BP check. She stated that it is doubtful. Pt endorses good FM daily. Pt agreed to come in on 05/31/12 @ 1400 for NST/AFI. She also will keep appt on 9/12 for Ob f/u and NST as scheduled. Pt voiced understanding of plan of care and agreed.

## 2012-05-31 ENCOUNTER — Ambulatory Visit (INDEPENDENT_AMBULATORY_CARE_PROVIDER_SITE_OTHER): Payer: Medicaid Other | Admitting: *Deleted

## 2012-05-31 VITALS — BP 130/76 | Wt >= 6400 oz

## 2012-05-31 DIAGNOSIS — O10019 Pre-existing essential hypertension complicating pregnancy, unspecified trimester: Secondary | ICD-10-CM

## 2012-05-31 NOTE — Progress Notes (Signed)
P = 90  Pt denies H/A's or visual disturbances.   Pt does not have baseline 24 hr urine- will collect and bring to prenatal visit on 06/03/12.

## 2012-06-02 NOTE — Progress Notes (Signed)
NST reviewed and reactive.  

## 2012-06-03 ENCOUNTER — Ambulatory Visit (HOSPITAL_COMMUNITY)
Admission: RE | Admit: 2012-06-03 | Discharge: 2012-06-03 | Disposition: A | Discharge status: Home / Self Care | Payer: Medicaid Other | Source: Ambulatory Visit | Attending: Obstetrics & Gynecology | Admitting: Obstetrics & Gynecology

## 2012-06-03 ENCOUNTER — Ambulatory Visit (INDEPENDENT_AMBULATORY_CARE_PROVIDER_SITE_OTHER): Payer: Medicaid Other | Admitting: General Practice

## 2012-06-03 VITALS — BP 143/91 | Temp 98.6°F | Wt >= 6400 oz

## 2012-06-03 DIAGNOSIS — O139 Gestational [pregnancy-induced] hypertension without significant proteinuria, unspecified trimester: Secondary | ICD-10-CM | POA: Insufficient documentation

## 2012-06-03 DIAGNOSIS — O99210 Obesity complicating pregnancy, unspecified trimester: Secondary | ICD-10-CM | POA: Insufficient documentation

## 2012-06-03 DIAGNOSIS — E669 Obesity, unspecified: Secondary | ICD-10-CM

## 2012-06-03 DIAGNOSIS — O368390 Maternal care for abnormalities of the fetal heart rate or rhythm, unspecified trimester, not applicable or unspecified: Secondary | ICD-10-CM

## 2012-06-03 DIAGNOSIS — O09299 Supervision of pregnancy with other poor reproductive or obstetric history, unspecified trimester: Secondary | ICD-10-CM | POA: Insufficient documentation

## 2012-06-03 DIAGNOSIS — O34219 Maternal care for unspecified type scar from previous cesarean delivery: Secondary | ICD-10-CM | POA: Insufficient documentation

## 2012-06-03 DIAGNOSIS — O10019 Pre-existing essential hypertension complicating pregnancy, unspecified trimester: Secondary | ICD-10-CM

## 2012-06-03 NOTE — Progress Notes (Signed)
BPP 8/10, done because NST performed today was reviewed and was found to be nonreactive.  Continue recommended antenatal testing and prenatal care. Continue to watch BP.

## 2012-06-03 NOTE — Progress Notes (Signed)
Pulse: 90

## 2012-06-03 NOTE — Progress Notes (Signed)
Prenatal visit not completed due to non reactive NST, patient sent to ultrasound department for BPP- report given to Dr. Macon Large

## 2012-06-07 ENCOUNTER — Other Ambulatory Visit: Payer: Medicaid Other

## 2012-06-10 ENCOUNTER — Ambulatory Visit (INDEPENDENT_AMBULATORY_CARE_PROVIDER_SITE_OTHER): Payer: Medicaid Other | Admitting: Family Medicine

## 2012-06-10 ENCOUNTER — Encounter: Payer: Self-pay | Admitting: Family Medicine

## 2012-06-10 VITALS — BP 129/78 | Temp 97.8°F | Wt >= 6400 oz

## 2012-06-10 DIAGNOSIS — E669 Obesity, unspecified: Secondary | ICD-10-CM

## 2012-06-10 DIAGNOSIS — O0990 Supervision of high risk pregnancy, unspecified, unspecified trimester: Secondary | ICD-10-CM

## 2012-06-10 DIAGNOSIS — O10019 Pre-existing essential hypertension complicating pregnancy, unspecified trimester: Secondary | ICD-10-CM

## 2012-06-10 DIAGNOSIS — O99210 Obesity complicating pregnancy, unspecified trimester: Secondary | ICD-10-CM

## 2012-06-10 LAB — POCT URINALYSIS DIP (DEVICE)
Bilirubin Urine: NEGATIVE
Glucose, UA: NEGATIVE mg/dL
Hgb urine dipstick: NEGATIVE
Nitrite: NEGATIVE

## 2012-06-10 NOTE — Progress Notes (Signed)
P = 95 "bad" pelvic pain

## 2012-06-10 NOTE — Patient Instructions (Signed)
Breastfeeding BENEFITS OF BREASTFEEDING For the baby  The first milk (colostrum) helps the baby's digestive system function better.   There are antibodies from the mother in the milk that help the baby fight off infections.   The baby has a lower incidence of asthma, allergies, and SIDS (sudden infant death syndrome).   The nutrients in breast milk are better than formulas for the baby and helps the baby's brain grow better.   Babies who breastfeed have less gas, colic, and constipation.  For the mother  Breastfeeding helps develop a very special bond between mother and baby.   It is more convenient, always available at the correct temperature and cheaper than formula feeding.   It burns calories in the mother and helps with losing weight that was gained during pregnancy.   It makes the uterus contract back down to normal size faster and slows bleeding following delivery.   Breastfeeding mothers have a lower risk of developing breast cancer.  NURSE FREQUENTLY  A healthy, full-term baby may breastfeed as often as every hour or space his or her feedings to every 3 hours.   How often to nurse will vary from baby to baby. Watch your baby for signs of hunger, not the clock.   Nurse as often as the baby requests, or when you feel the need to reduce the fullness of your breasts.   Awaken the baby if it has been 3 to 4 hours since the last feeding.   Frequent feeding will help the mother make more milk and will prevent problems like sore nipples and engorgement of the breasts.  BABY'S POSITION AT THE BREAST  Whether lying down or sitting, be sure that the baby's tummy is facing your tummy.   Support the breast with 4 fingers underneath the breast and the thumb above. Make sure your fingers are well away from the nipple and baby's mouth.   Stroke the baby's lips and cheek closest to the breast gently with your finger or nipple.   When the baby's mouth is open wide enough, place all  of your nipple and as much of the dark area around the nipple as possible into your baby's mouth.   Pull the baby in close so the tip of the nose and the baby's cheeks touch the breast during the feeding.  FEEDINGS  The length of each feeding varies from baby to baby and from feeding to feeding.   The baby must suck about 2 to 3 minutes for your milk to get to him or her. This is called a "let down." For this reason, allow the baby to feed on each breast as long as he or she wants. Your baby will end the feeding when he or she has received the right balance of nutrients.   To break the suction, put your finger into the corner of the baby's mouth and slide it between his or her gums before removing your breast from his or her mouth. This will help prevent sore nipples.  REDUCING BREAST ENGORGEMENT  In the first week after your baby is born, you may experience signs of breast engorgement. When breasts are engorged, they feel heavy, warm, full, and may be tender to the touch. You can reduce engorgement if you:   Nurse frequently, every 2 to 3 hours. Mothers who breastfeed early and often have fewer problems with engorgement.   Place light ice packs on your breasts between feedings. This reduces swelling. Wrap the ice packs in a   lightweight towel to protect your skin.   Apply moist hot packs to your breast for 5 to 10 minutes before each feeding. This increases circulation and helps the milk flow.   Gently massage your breast before and during the feeding.   Make sure that the baby empties at least one breast at every feeding before switching sides.   Use a breast pump to empty the breasts if your baby is sleepy or not nursing well. You may also want to pump if you are returning to work or or you feel you are getting engorged.   Avoid bottle feeds, pacifiers or supplemental feedings of water or juice in place of breastfeeding.   Be sure the baby is latched on and positioned properly while  breastfeeding.   Prevent fatigue, stress, and anemia.   Wear a supportive bra, avoiding underwire styles.   Eat a balanced diet with enough fluids.  If you follow these suggestions, your engorgement should improve in 24 to 48 hours. If you are still experiencing difficulty, call your lactation consultant or caregiver. IS MY BABY GETTING ENOUGH MILK? Sometimes, mothers worry about whether their babies are getting enough milk. You can be assured that your baby is getting enough milk if:  The baby is actively sucking and you hear swallowing.   The baby nurses at least 8 to 12 times in a 24 hour time period. Nurse your baby until he or she unlatches or falls asleep at the first breast (at least 10 to 20 minutes), then offer the second side.   The baby is wetting 5 to 6 disposable diapers (6 to 8 cloth diapers) in a 24 hour period by 5 to 6 days of age.   The baby is having at least 2 to 3 stools every 24 hours for the first few months. Breast milk is all the food your baby needs. It is not necessary for your baby to have water or formula. In fact, to help your breasts make more milk, it is best not to give your baby supplemental feedings during the early weeks.   The stool should be soft and yellow.   The baby should gain 4 to 7 ounces per week after he is 4 days old.  TAKE CARE OF YOURSELF Take care of your breasts by:  Bathing or showering daily.   Avoiding the use of soaps on your nipples.   Start feedings on your left breast at one feeding and on your right breast at the next feeding.   You will notice an increase in your milk supply 2 to 5 days after delivery. You may feel some discomfort from engorgement, which makes your breasts very firm and often tender. Engorgement "peaks" out within 24 to 48 hours. In the meantime, apply warm moist towels to your breasts for 5 to 10 minutes before feeding. Gentle massage and expression of some milk before feeding will soften your breasts, making  it easier for your baby to latch on. Wear a well fitting nursing bra and air dry your nipples for 10 to 15 minutes after each feeding.   Only use cotton bra pads.   Only use pure lanolin on your nipples after nursing. You do not need to wash it off before nursing.  Take care of yourself by:   Eating well-balanced meals and nutritious snacks.   Drinking milk, fruit juice, and water to satisfy your thirst (about 8 glasses a day).   Getting plenty of rest.   Increasing calcium in   your diet (1200 mg a day).   Avoiding foods that you notice affect the baby in a bad way.  SEEK MEDICAL CARE IF:   You have any questions or difficulty with breastfeeding.   You need help.   You have a hard, red, sore area on your breast, accompanied by a fever of 100.5 F (38.1 C) or more.   Your baby is too sleepy to eat well or is having trouble sleeping.   Your baby is wetting less than 6 diapers per day, by 5 days of age.   Your baby's skin or white part of his or her eyes is more yellow than it was in the hospital.   You feel depressed.  Document Released: 09/08/2005 Document Revised: 08/28/2011 Document Reviewed: 04/23/2009 [employer] Patient Information 2012 [employer]. 

## 2012-06-10 NOTE — Progress Notes (Signed)
Cultures today NST reviewed and reactive. Schedule repeat C-S at 39 wks.  For Nexplanon. BP good today

## 2012-06-11 LAB — GC/CHLAMYDIA PROBE AMP, GENITAL
Chlamydia, DNA Probe: NEGATIVE
GC Probe Amp, Genital: NEGATIVE

## 2012-06-13 LAB — CULTURE, BETA STREP (GROUP B ONLY)

## 2012-06-14 ENCOUNTER — Encounter (HOSPITAL_COMMUNITY): Payer: Self-pay | Admitting: Pharmacist

## 2012-06-17 ENCOUNTER — Encounter (HOSPITAL_COMMUNITY)
Admission: RE | Admit: 2012-06-17 | Discharge: 2012-06-17 | Disposition: A | Discharge status: Home / Self Care | Payer: Medicaid Other | Source: Ambulatory Visit | Attending: Family Medicine | Admitting: Family Medicine

## 2012-06-17 ENCOUNTER — Encounter (HOSPITAL_COMMUNITY): Payer: Self-pay

## 2012-06-17 ENCOUNTER — Encounter: Payer: Self-pay | Admitting: Obstetrics & Gynecology

## 2012-06-17 ENCOUNTER — Other Ambulatory Visit: Payer: Medicaid Other

## 2012-06-17 VITALS — BP 133/84 | HR 85 | Ht 71.0 in | Wt >= 6400 oz

## 2012-06-17 DIAGNOSIS — O99210 Obesity complicating pregnancy, unspecified trimester: Secondary | ICD-10-CM

## 2012-06-17 DIAGNOSIS — O34219 Maternal care for unspecified type scar from previous cesarean delivery: Secondary | ICD-10-CM

## 2012-06-17 DIAGNOSIS — O0990 Supervision of high risk pregnancy, unspecified, unspecified trimester: Secondary | ICD-10-CM

## 2012-06-17 DIAGNOSIS — O10019 Pre-existing essential hypertension complicating pregnancy, unspecified trimester: Secondary | ICD-10-CM

## 2012-06-17 LAB — CBC
MCHC: 29.8 g/dL — ABNORMAL LOW (ref 30.0–36.0)
Platelets: 334 10*3/uL (ref 150–400)
RDW: 15 % (ref 11.5–15.5)

## 2012-06-17 LAB — TYPE AND SCREEN: ABO/RH(D): B POS

## 2012-06-17 NOTE — Patient Instructions (Addendum)
   Your procedure is scheduled ZO:[id number] October 1st  Enter through the Main Entrance of [facility] at: 6am Pick up the phone at the desk and dial [phone] and inform us of your arrival.  Please call this number if you have any problems the morning of surgery: [phone]  Remember: Do not eat or drink anything after midnight on Monday  Do not wear jewelry, make-up, or FINGER nail polish No metal in your hair or on your body. Do not wear lotions, powders, perfumes. You may wear deodorant.  Please use your CHG wash as directed prior to surgery.  Do not shave anywhere for at least 12 hours prior to first CHG shower.  Do not bring valuables to the hospital.  Leave suitcase in the car. After Surgery it may be brought to your room. For patients being admitted to the hospital, checkout time is 11:00am the day of discharge.

## 2012-06-18 LAB — RPR: RPR Ser Ql: NONREACTIVE

## 2012-06-22 ENCOUNTER — Encounter (HOSPITAL_COMMUNITY): Payer: Self-pay | Admitting: Anesthesiology

## 2012-06-22 ENCOUNTER — Encounter (HOSPITAL_COMMUNITY): Payer: Self-pay | Admitting: *Deleted

## 2012-06-22 ENCOUNTER — Inpatient Hospital Stay (HOSPITAL_COMMUNITY): Payer: Medicaid Other | Admitting: Anesthesiology

## 2012-06-22 ENCOUNTER — Inpatient Hospital Stay (HOSPITAL_COMMUNITY)
Admission: AD | Admit: 2012-06-22 | Discharge: 2012-06-24 | DRG: 766 | Disposition: A | Discharge status: Home / Self Care | Payer: Medicaid Other | Source: Ambulatory Visit | Attending: Family Medicine | Admitting: Family Medicine

## 2012-06-22 ENCOUNTER — Encounter (HOSPITAL_COMMUNITY)
Admission: AD | Disposition: A | Discharge status: Home / Self Care | Payer: Self-pay | Source: Ambulatory Visit | Attending: Family Medicine

## 2012-06-22 DIAGNOSIS — O99214 Obesity complicating childbirth: Secondary | ICD-10-CM

## 2012-06-22 DIAGNOSIS — E669 Obesity, unspecified: Secondary | ICD-10-CM | POA: Diagnosis present

## 2012-06-22 DIAGNOSIS — O99210 Obesity complicating pregnancy, unspecified trimester: Secondary | ICD-10-CM | POA: Diagnosis present

## 2012-06-22 DIAGNOSIS — O0990 Supervision of high risk pregnancy, unspecified, unspecified trimester: Secondary | ICD-10-CM

## 2012-06-22 DIAGNOSIS — O34219 Maternal care for unspecified type scar from previous cesarean delivery: Secondary | ICD-10-CM

## 2012-06-22 DIAGNOSIS — O99892 Other specified diseases and conditions complicating childbirth: Secondary | ICD-10-CM | POA: Diagnosis present

## 2012-06-22 DIAGNOSIS — R030 Elevated blood-pressure reading, without diagnosis of hypertension: Secondary | ICD-10-CM | POA: Diagnosis present

## 2012-06-22 DIAGNOSIS — O9989 Other specified diseases and conditions complicating pregnancy, childbirth and the puerperium: Secondary | ICD-10-CM

## 2012-06-22 DIAGNOSIS — O10019 Pre-existing essential hypertension complicating pregnancy, unspecified trimester: Secondary | ICD-10-CM | POA: Diagnosis present

## 2012-06-22 LAB — PREPARE RBC (CROSSMATCH)

## 2012-06-22 SURGERY — Surgical Case
Anesthesia: Spinal | Site: Abdomen | Wound class: Clean Contaminated

## 2012-06-22 MED ORDER — MENTHOL 3 MG MT LOZG: 1.0000 | LOZENGE | OROMUCOSAL | Status: DC | PRN

## 2012-06-22 MED ORDER — NALBUPHINE HCL 10 MG/ML IJ SOLN
5.0000 mg | INTRAMUSCULAR | Status: DC | PRN
  Administered 2012-06-22: 5 mg via SUBCUTANEOUS
  Filled 2012-06-22: qty 1

## 2012-06-22 MED ORDER — WITCH HAZEL-GLYCERIN EX PADS: 1.0000 "application " | MEDICATED_PAD | CUTANEOUS | Status: DC | PRN

## 2012-06-22 MED ORDER — KETOROLAC TROMETHAMINE 30 MG/ML IJ SOLN: 15.0000 mg | Freq: Once | INTRAMUSCULAR | Status: DC | PRN

## 2012-06-22 MED ORDER — ONDANSETRON HCL 4 MG/2ML IJ SOLN
INTRAMUSCULAR | Status: DC | PRN
  Administered 2012-06-22: 4 mg via INTRAVENOUS

## 2012-06-22 MED ORDER — LACTATED RINGERS IV SOLN
INTRAVENOUS | Status: DC
  Administered 2012-06-22: 09:00:00 via INTRAVENOUS
  Administered 2012-06-22: 125 mL/h via INTRAVENOUS

## 2012-06-22 MED ORDER — SCOPOLAMINE 1 MG/3DAYS TD PT72
1.0000 | MEDICATED_PATCH | Freq: Once | TRANSDERMAL | Status: DC
  Filled 2012-06-22: qty 1

## 2012-06-22 MED ORDER — FENTANYL CITRATE 0.05 MG/ML IJ SOLN
INTRAMUSCULAR | Status: DC | PRN
  Administered 2012-06-22 (×3): 50 ug via INTRAVENOUS
  Administered 2012-06-22: 25 ug via INTRAVENOUS

## 2012-06-22 MED ORDER — MEPERIDINE HCL 25 MG/ML IJ SOLN: 6.2500 mg | INTRAMUSCULAR | Status: DC | PRN

## 2012-06-22 MED ORDER — DIPHENHYDRAMINE HCL 25 MG PO CAPS: 25.0000 mg | ORAL_CAPSULE | Freq: Four times a day (QID) | ORAL | Status: DC | PRN

## 2012-06-22 MED ORDER — DIPHENHYDRAMINE HCL 50 MG/ML IJ SOLN: 12.5000 mg | INTRAMUSCULAR | Status: DC | PRN

## 2012-06-22 MED ORDER — BUPIVACAINE HCL (PF) 0.25 % IJ SOLN
INTRAMUSCULAR | Status: AC
  Filled 2012-06-22: qty 30

## 2012-06-22 MED ORDER — LANOLIN HYDROUS EX OINT: 1.0000 "application " | TOPICAL_OINTMENT | CUTANEOUS | Status: DC | PRN

## 2012-06-22 MED ORDER — ONDANSETRON HCL 4 MG/2ML IJ SOLN
INTRAMUSCULAR | Status: AC
  Filled 2012-06-22: qty 2

## 2012-06-22 MED ORDER — DIBUCAINE 1 % RE OINT: 1.0000 "application " | TOPICAL_OINTMENT | RECTAL | Status: DC | PRN

## 2012-06-22 MED ORDER — ONDANSETRON HCL 4 MG/2ML IJ SOLN: 4.0000 mg | INTRAMUSCULAR | Status: DC | PRN

## 2012-06-22 MED ORDER — NALBUPHINE SYRINGE 5 MG/0.5 ML
INJECTION | INTRAMUSCULAR | Status: AC
  Administered 2012-06-22: 5 mg via SUBCUTANEOUS
  Filled 2012-06-22: qty 0.5

## 2012-06-22 MED ORDER — SIMETHICONE 80 MG PO CHEW: 80.0000 mg | CHEWABLE_TABLET | ORAL | Status: DC | PRN

## 2012-06-22 MED ORDER — OXYTOCIN 40 UNITS IN LACTATED RINGERS INFUSION - SIMPLE MED: 62.5000 mL/h | INTRAVENOUS | Status: AC

## 2012-06-22 MED ORDER — SODIUM BICARBONATE 8.4 % IV SOLN
INTRAVENOUS | Status: DC | PRN
  Administered 2012-06-22: 3 mL via EPIDURAL

## 2012-06-22 MED ORDER — ACETAMINOPHEN 10 MG/ML IV SOLN
1000.0000 mg | Freq: Four times a day (QID) | INTRAVENOUS | Status: AC | PRN
  Filled 2012-06-22: qty 100

## 2012-06-22 MED ORDER — DIPHENHYDRAMINE HCL 25 MG PO CAPS: 25.0000 mg | ORAL_CAPSULE | ORAL | Status: DC | PRN

## 2012-06-22 MED ORDER — ZOLPIDEM TARTRATE 5 MG PO TABS: 5.0000 mg | ORAL_TABLET | Freq: Every evening | ORAL | Status: DC | PRN

## 2012-06-22 MED ORDER — SODIUM CHLORIDE 0.9 % IV SOLN
1.0000 ug/kg/h | INTRAVENOUS | Status: DC | PRN
  Filled 2012-06-22: qty 2.5

## 2012-06-22 MED ORDER — FENTANYL CITRATE 0.05 MG/ML IJ SOLN
INTRAMUSCULAR | Status: AC
  Filled 2012-06-22: qty 2

## 2012-06-22 MED ORDER — LIDOCAINE-EPINEPHRINE 2 %-1:100000 IJ SOLN: INTRAMUSCULAR | Status: DC | PRN

## 2012-06-22 MED ORDER — ONDANSETRON HCL 4 MG/2ML IJ SOLN: 4.0000 mg | Freq: Three times a day (TID) | INTRAMUSCULAR | Status: DC | PRN

## 2012-06-22 MED ORDER — OXYTOCIN 10 UNIT/ML IJ SOLN
40.0000 [IU] | INTRAVENOUS | Status: DC | PRN
  Administered 2012-06-22: 40 [IU] via INTRAVENOUS

## 2012-06-22 MED ORDER — NALBUPHINE HCL 10 MG/ML IJ SOLN
5.0000 mg | INTRAMUSCULAR | Status: DC | PRN
  Filled 2012-06-22: qty 1

## 2012-06-22 MED ORDER — KETOROLAC TROMETHAMINE 30 MG/ML IJ SOLN
30.0000 mg | Freq: Four times a day (QID) | INTRAMUSCULAR | Status: AC | PRN
  Administered 2012-06-22: 30 mg via INTRAMUSCULAR

## 2012-06-22 MED ORDER — SENNOSIDES-DOCUSATE SODIUM 8.6-50 MG PO TABS
2.0000 | ORAL_TABLET | Freq: Every day | ORAL | Status: DC
  Administered 2012-06-22 – 2012-06-23 (×2): 2 via ORAL

## 2012-06-22 MED ORDER — BUPIVACAINE HCL (PF) 0.25 % IJ SOLN
INTRAMUSCULAR | Status: DC | PRN
  Administered 2012-06-22: 30 mL

## 2012-06-22 MED ORDER — ONDANSETRON HCL 4 MG PO TABS: 4.0000 mg | ORAL_TABLET | ORAL | Status: DC | PRN

## 2012-06-22 MED ORDER — PRENATAL MULTIVITAMIN CH
1.0000 | ORAL_TABLET | Freq: Every day | ORAL | Status: DC
  Administered 2012-06-22 – 2012-06-24 (×3): 1 via ORAL
  Filled 2012-06-22 (×5): qty 1

## 2012-06-22 MED ORDER — KETOROLAC TROMETHAMINE 30 MG/ML IJ SOLN
INTRAMUSCULAR | Status: AC
  Administered 2012-06-22: 30 mg via INTRAMUSCULAR
  Filled 2012-06-22: qty 1

## 2012-06-22 MED ORDER — SCOPOLAMINE 1 MG/3DAYS TD PT72
1.0000 | MEDICATED_PATCH | Freq: Once | TRANSDERMAL | Status: DC
  Administered 2012-06-22: 1.5 mg via TRANSDERMAL

## 2012-06-22 MED ORDER — OXYCODONE-ACETAMINOPHEN 5-325 MG PO TABS
1.0000 | ORAL_TABLET | ORAL | Status: DC | PRN
  Administered 2012-06-23 (×2): 1 via ORAL
  Administered 2012-06-24 (×2): 2 via ORAL
  Filled 2012-06-22 (×2): qty 1
  Filled 2012-06-22 (×2): qty 2

## 2012-06-22 MED ORDER — MORPHINE SULFATE 0.5 MG/ML IJ SOLN
INTRAMUSCULAR | Status: AC
  Filled 2012-06-22: qty 10

## 2012-06-22 MED ORDER — DIPHENHYDRAMINE HCL 50 MG/ML IJ SOLN: 25.0000 mg | INTRAMUSCULAR | Status: DC | PRN

## 2012-06-22 MED ORDER — PROMETHAZINE HCL 25 MG/ML IJ SOLN: 6.2500 mg | INTRAMUSCULAR | Status: DC | PRN

## 2012-06-22 MED ORDER — PHENYLEPHRINE HCL 10 MG/ML IJ SOLN
INTRAMUSCULAR | Status: DC | PRN
  Administered 2012-06-22: 40 ug via INTRAVENOUS

## 2012-06-22 MED ORDER — LACTATED RINGERS IV SOLN
INTRAVENOUS | Status: DC | PRN
  Administered 2012-06-22 (×2): via INTRAVENOUS

## 2012-06-22 MED ORDER — IBUPROFEN 600 MG PO TABS
600.0000 mg | ORAL_TABLET | Freq: Four times a day (QID) | ORAL | Status: DC
  Administered 2012-06-22 – 2012-06-24 (×8): 600 mg via ORAL
  Filled 2012-06-22 (×7): qty 1

## 2012-06-22 MED ORDER — SIMETHICONE 80 MG PO CHEW
80.0000 mg | CHEWABLE_TABLET | Freq: Three times a day (TID) | ORAL | Status: DC
  Administered 2012-06-22 – 2012-06-24 (×6): 80 mg via ORAL

## 2012-06-22 MED ORDER — KETOROLAC TROMETHAMINE 30 MG/ML IJ SOLN: 30.0000 mg | Freq: Four times a day (QID) | INTRAMUSCULAR | Status: AC | PRN

## 2012-06-22 MED ORDER — DEXTROSE 5 % IV SOLN
3.0000 g | Freq: Once | INTRAVENOUS | Status: AC
  Administered 2012-06-22: 3 g via INTRAVENOUS
  Filled 2012-06-22: qty 3000

## 2012-06-22 MED ORDER — TETANUS-DIPHTH-ACELL PERTUSSIS 5-2.5-18.5 LF-MCG/0.5 IM SUSP
0.5000 mL | Freq: Once | INTRAMUSCULAR | Status: AC
  Administered 2012-06-22: 0.5 mL via INTRAMUSCULAR
  Filled 2012-06-22: qty 0.5

## 2012-06-22 MED ORDER — MORPHINE SULFATE (PF) 0.5 MG/ML IJ SOLN
INTRAMUSCULAR | Status: DC | PRN
  Administered 2012-06-22: 4 mg via EPIDURAL
  Administered 2012-06-22: 1 mg via EPIDURAL

## 2012-06-22 MED ORDER — MEASLES, MUMPS & RUBELLA VAC ~~LOC~~ INJ
0.5000 mL | INJECTION | Freq: Once | SUBCUTANEOUS | Status: DC
  Filled 2012-06-22: qty 0.5

## 2012-06-22 MED ORDER — EPHEDRINE SULFATE 50 MG/ML IJ SOLN
INTRAMUSCULAR | Status: DC | PRN
  Administered 2012-06-22: 5 mg via INTRAVENOUS

## 2012-06-22 MED ORDER — DEXTROSE IN LACTATED RINGERS 5 % IV SOLN: INTRAVENOUS | Status: DC

## 2012-06-22 MED ORDER — SODIUM CHLORIDE 0.9 % IJ SOLN: 3.0000 mL | INTRAMUSCULAR | Status: DC | PRN

## 2012-06-22 MED ORDER — 0.9 % SODIUM CHLORIDE (POUR BTL) OPTIME
TOPICAL | Status: DC | PRN
  Administered 2012-06-22: 1000 mL

## 2012-06-22 MED ORDER — NALOXONE HCL 0.4 MG/ML IJ SOLN: 0.4000 mg | INTRAMUSCULAR | Status: DC | PRN

## 2012-06-22 MED ORDER — MIDAZOLAM HCL 2 MG/2ML IJ SOLN: 0.5000 mg | Freq: Once | INTRAMUSCULAR | Status: DC | PRN

## 2012-06-22 MED ORDER — METOCLOPRAMIDE HCL 5 MG/ML IJ SOLN: 10.0000 mg | Freq: Three times a day (TID) | INTRAMUSCULAR | Status: DC | PRN

## 2012-06-22 MED ORDER — SCOPOLAMINE 1 MG/3DAYS TD PT72
MEDICATED_PATCH | TRANSDERMAL | Status: AC
  Administered 2012-06-22: 1.5 mg via TRANSDERMAL
  Filled 2012-06-22: qty 1

## 2012-06-22 SURGICAL SUPPLY — 32 items
CLOTH BEACON ORANGE TIMEOUT ST (SAFETY) ×2 IMPLANT
DRAPE SURG 17X23 STRL (DRAPES) ×2 IMPLANT
DRESSING TELFA 8X3 (GAUZE/BANDAGES/DRESSINGS) ×2 IMPLANT
DRSG COVADERM 4X10 (GAUZE/BANDAGES/DRESSINGS) ×1 IMPLANT
DURAPREP 26ML APPLICATOR (WOUND CARE) ×2 IMPLANT
ELECT REM PT RETURN 9FT ADLT (ELECTROSURGICAL) ×2
ELECTRODE REM PT RTRN 9FT ADLT (ELECTROSURGICAL) ×1 IMPLANT
EXTRACTOR VACUUM M CUP 4 TUBE (SUCTIONS) IMPLANT
GAUZE SPONGE 4X4 12PLY STRL LF (GAUZE/BANDAGES/DRESSINGS) ×4 IMPLANT
GLOVE BIOGEL PI IND STRL 7.0 (GLOVE) ×1 IMPLANT
GLOVE BIOGEL PI INDICATOR 7.0 (GLOVE) ×1
GLOVE ECLIPSE 7.0 STRL STRAW (GLOVE) ×4 IMPLANT
GOWN PREVENTION PLUS LG XLONG (DISPOSABLE) ×4 IMPLANT
GOWN PREVENTION PLUS XLARGE (GOWN DISPOSABLE) ×2 IMPLANT
KIT ABG SYR 3ML LUER SLIP (SYRINGE) IMPLANT
NDL HYPO 25X5/8 SAFETYGLIDE (NEEDLE) IMPLANT
NEEDLE HYPO 22GX1.5 SAFETY (NEEDLE) ×2 IMPLANT
NEEDLE HYPO 25X5/8 SAFETYGLIDE (NEEDLE) IMPLANT
NS IRRIG 1000ML POUR BTL (IV SOLUTION) ×2 IMPLANT
PACK C SECTION WH (CUSTOM PROCEDURE TRAY) ×2 IMPLANT
PAD ABD 7.5X8 STRL (GAUZE/BANDAGES/DRESSINGS) IMPLANT
PAD OB MATERNITY 4.3X12.25 (PERSONAL CARE ITEMS) IMPLANT
RTRCTR C-SECT PINK 25CM LRG (MISCELLANEOUS) IMPLANT
SLEEVE SCD COMPRESS KNEE MED (MISCELLANEOUS) IMPLANT
STAPLER VISISTAT 35W (STAPLE) IMPLANT
SUT VIC AB 0 CTX 36 (SUTURE) ×6
SUT VIC AB 0 CTX36XBRD ANBCTRL (SUTURE) ×3 IMPLANT
SUT VIC AB 4-0 KS 27 (SUTURE) IMPLANT
SYR 30ML LL (SYRINGE) ×2 IMPLANT
TOWEL OR 17X24 6PK STRL BLUE (TOWEL DISPOSABLE) ×4 IMPLANT
TRAY FOLEY CATH 14FR (SET/KITS/TRAYS/PACK) ×2 IMPLANT
WATER STERILE IRR 1000ML POUR (IV SOLUTION) ×1 IMPLANT

## 2012-06-22 NOTE — Anesthesia Procedure Notes (Signed)
Epidural Patient location during procedure: OB Start time: 06/22/2012 9:32 AM  Staffing Anesthesiologist: Brayton Caves R Performed by: anesthesiologist   Preanesthetic Checklist Completed: patient identified, site marked, surgical consent, pre-op evaluation, timeout performed, IV checked, risks and benefits discussed and monitors and equipment checked  Epidural Patient position: sitting Prep: site prepped and draped and DuraPrep Patient monitoring: continuous pulse ox and blood pressure Approach: midline Injection technique: LOR air and LOR saline  Needle:  Needle type: Tuohy  Needle gauge: 17 G Needle length: 9 cm and 9 Needle insertion depth: 9 cm Catheter type: closed end flexible Catheter size: 19 Gauge Catheter at skin depth: 15 cm Test dose: negative  Assessment Events: blood not aspirated, injection not painful, no injection resistance, negative IV test and no paresthesia  Additional Notes Patient identified.  Risk benefits discussed including failed block, incomplete pain control, headache, nerve damage, paralysis, blood pressure changes, nausea, vomiting, reactions to medication both toxic or allergic, and postpartum back pain.  Patient expressed understanding and wished to proceed.  All questions were answered.  Sterile technique used throughout procedure and epidural site dressed with sterile barrier dressing. No paresthesia or other complications noted.The patient did not experience any signs of intravascular injection such as tinnitus or metallic taste in mouth nor signs of intrathecal spread such as rapid motor block. Please see nursing notes for vital signs.  Initial attempts at spinal were difficult.  Patient unable to tolerate therefore we switched to epidural.  First two attempts at spinal were by [facility].  Final attempt at spinal by [facility] then epidural placed by [facility].

## 2012-06-22 NOTE — Interval H&P Note (Signed)
History and Physical Interval Note:  06/22/2012 8:40 AM  Sarah Nash  has presented today for surgery, with the diagnosis of Repeat section x 2 with morbid obesity  The various methods of treatment have been discussed with the patient and family. After consideration of risks, benefits and other options for treatment, the patient has consented to  Procedure(s) (LRB) with comments: CESAREAN SECTION (N/A) as a surgical intervention .  The patient's history has been reviewed, patient examined, no change in status, stable for surgery.  I have reviewed the patient's chart and labs.  Questions were answered to the patient's satisfaction.  Risks of repeat cesarean section reviewed.  Risks include but are not limited to bleeding, infection, injury to surrounding structures, hysterectomy, blood clots, and death.    Lindon Kiel S

## 2012-06-22 NOTE — Anesthesia Preprocedure Evaluation (Addendum)
Anesthesia Evaluation  Patient identified by MRN, date of birth, ID band Patient awake    Reviewed: Allergy & Precautions, H&P , NPO status , Patient's Chart, lab work & pertinent test results  Airway Mallampati: II      Dental No notable dental hx.    Pulmonary neg pulmonary ROS,  breath sounds clear to auscultation  Pulmonary exam normal       Cardiovascular Exercise Tolerance: Good hypertension, negative cardio ROS  Rhythm:regular Rate:Normal     Neuro/Psych PSYCHIATRIC DISORDERS negative neurological ROS  negative psych ROS   GI/Hepatic negative GI ROS, Neg liver ROS,   Endo/Other  negative endocrine ROSMorbid obesity  Renal/GU negative Renal ROS  negative genitourinary   Musculoskeletal   Abdominal Normal abdominal exam  (+)   Peds  Hematology negative hematology ROS (+)   Anesthesia Other Findings Depression     Obesity        Abnormal Pap smear 2006   Chlamydia april 2013      Pregnancy induced hypertension    Reproductive/Obstetrics (+) Pregnancy                          Anesthesia Physical Anesthesia Plan  ASA: III  Anesthesia Plan: Spinal   Post-op Pain Management:    Induction:   Airway Management Planned:   Additional Equipment:   Intra-op Plan:   Post-operative Plan:   Informed Consent: I have reviewed the patients History and Physical, chart, labs and discussed the procedure including the risks, benefits and alternatives for the proposed anesthesia with the patient or authorized representative who has indicated his/her understanding and acceptance.     Plan Discussed with: Anesthesiologist, CRNA and Surgeon  Anesthesia Plan Comments:         Anesthesia Quick Evaluation

## 2012-06-22 NOTE — Op Note (Addendum)
Preoperative Diagnosis:  IUP @ [redacted]w[redacted]d, Previous C-section x 2, morbid obesity, borderline BP  Postoperative Diagnosis:  Same  Procedure: Third repeat low transverse cesarean section  Surgeon: Tinnie Gens, M.D.  Assistant: Elsie Lincoln, MD  Anesthesia:  Epidural Velna Hatchet, MD  Findings: Viable female infant, APGAR (1 MIN): 8  APGAR (5 MINS): 9   Vertex presentation, LOA position  Estimated blood loss: 1000 cc  Complications: None known  Specimens: Placenta to labor and delivery  Reason for procedure: Briefly, the patient is a 31 y.o. [id number] [redacted]w[redacted]d who presents for elective third repeat C-section.  She has morbid obesity and transiently elevated BP's this pregnancy.  Procedure: Patient is a to the OR where epidural analgesia was administered. She was then placed in a supine position with left lateral tilt. She received 3 g of Ancef and SCDs were in place. A timeout was performed. She was prepped and draped in the usual sterile fashion. A Foley catheter was placed in the bladder. A knife was then used to make a Pfannenstiel incision. This incision was carried out to underlying fascia which was divided in the midline with the knife. The incision was extended laterally, sharply.  The fascia was dissected off the underlying rectus superiorly, sharply in the midline and bluntly laterally. The rectus was divided in the midline.  The peritoneal cavity was entered bluntly.  Alexis retractor was placed inside the incision.  A knife was used to make a low transverse incision on the uterus. This incision was carried down to the amniotic cavity was entered. Fetus was in LOA position and was brought up out of the incision without difficulty. Cord was clamped x 2 and cut. Infant taken to waiting pediatrician.  Placenta was delivered from the uterus and given to cord blood team.  Uterus was cleaned with dry lap pads. Uterine incision closed with 0 Vicryl suture in a locked running fashion.  Alexis  retractor was removed from the abdomen. Peritoneal closure was done with 0 Vicryl suture.  Fascia is closed with 0 Vicryl suture in a running fashion. Subcutaneous tissue infused with 30cc 0.25% Marcaine.  Subcutaneous closure was performed with 0 plain suture.  Skin closed using 3-0 Vicryl on a Keith needle.  Wound vac was then applied.  All instrument, needle and lap counts were correct x 2.  Patient was awake and taken to PACU stable.  Infant to Newborn Nursery, stable.   Esabella Stockinger SMD 06/22/2012 10:53 AM

## 2012-06-22 NOTE — Anesthesia Postprocedure Evaluation (Signed)
  Anesthesia Post-op Note  Patient: Sarah Nash  Procedure(s) Performed: Procedure(s) (LRB) with comments: CESAREAN SECTION (N/A)  Patient Location: Mother/Baby  Anesthesia Type: Epidural  Level of Consciousness: awake, alert  and oriented  Airway and Oxygen Therapy: Patient Spontanous Breathing  Post-op Pain: none  Post-op Assessment: Post-op Vital signs reviewed and Patient's Cardiovascular Status Stable  Post-op Vital Signs: Reviewed and stable  Complications: No apparent anesthesia complications

## 2012-06-22 NOTE — H&P (Signed)
Sarah Nash is a 31 y.o. female presenting for repeat C section.  Denies recent vaginal bleeding.  Admits to a milkly white, intermittent d/c, mild contractions, positive baby movement.  Admits only to pelvic pain, constant.  Hurts to walk because of this.  Had previously been seen in the ER for back cramping in July.  History OB History    Grav Para Term Preterm Abortions TAB SAB Ect Mult Living   3 2 2       2      Past Medical History  Diagnosis Date  . Depression   . Obesity   . Abnormal Pap smear 2006  . Chlamydia april 2013  . Pregnancy induced hypertension     "they are watching my b/p"   Past Surgical History  Procedure Date  . Cesarean section 2003,2009   Family History: family history includes Hypertension in her father and Mental illness in her father.  There is no history of Anesthesia problems. Social History:  reports that she quit smoking about 8 months ago. She has never used smokeless tobacco. She reports that she does not drink alcohol or use illicit drugs.   Prenatal Transfer Tool  Maternal Diabetes: No Genetic Screening: Abnormal:  Results: Other: Too late for screening Maternal Ultrasounds/Referrals: Normal Fetal Ultrasounds or other Referrals:  None Maternal Substance Abuse:  No Significant Maternal Medications:  None Significant Maternal Lab Results:  None Other Comments:  None  ROS Gen: Denies fever CV: Denies CP Pulm: Denies SOB Abd: Denies Abdominal pain other than contractions.  Denies diarrhea. GU: Denies difficulty urinating    Blood pressure 142/80, pulse 93, temperature 98.2 F (36.8 C), temperature source Oral, resp. rate 18, last menstrual period 09/23/2011, SpO2 100.00%. Exam Physical Exam  Gen: AAOx3, NAD CV: RRR, no MRG, pulses intact b/l Pulm: CTA Abd: Soft, NT/ND, +BS Ext: +1 non-pitting edema b/l  Prenatal labs: ABO, Rh: --/--/B POS, B POS (09/26 1515) Antibody: NEG (09/26 1515) Rubella: Immune (06/05 0000) RPR: NON  REACTIVE (09/26 1515)  HBsAg: Negative (06/05 0000)  HIV: Non-reactive (06/05 0000)  GBS:     Assessment/Plan: 31yoF with 2 previous C-sections here for a repeat C-section  #C Section - Ancef 3g - C section planned for 9am - no home medications - nexplanon post-procedure - will breastfeed  Sonia Side 06/22/2012, 6:43 AM

## 2012-06-22 NOTE — Addendum Note (Signed)
Addendum  created 06/22/12 1725 by Shanon Payor, CRNA   Modules edited:Notes Section

## 2012-06-22 NOTE — H&P (Signed)
Chart reviewed and agree with management and plan.  

## 2012-06-22 NOTE — Transfer of Care (Signed)
Immediate Anesthesia Transfer of Care Note  Patient: Sarah Nash  Procedure(s) Performed: Procedure(s) (LRB) with comments: CESAREAN SECTION (N/A)  Patient Location: PACU  Anesthesia Type: Epidural  Level of Consciousness: awake, alert  and oriented  Airway & Oxygen Therapy: Patient Spontanous Breathing  Post-op Assessment: Report given to PACU RN and Post -op Vital signs reviewed and stable  Post vital signs: Reviewed and stable  Complications: No apparent anesthesia complications

## 2012-06-22 NOTE — Anesthesia Postprocedure Evaluation (Signed)
Anesthesia Post Note  Patient: Sarah Nash  Procedure(s) Performed: Procedure(s) (LRB): CESAREAN SECTION (N/A)  Anesthesia type: Epidural  Patient location: pacu  Post pain: Pain level controlled  Post assessment: Post-op Vital signs reviewed  Last Vitals:  Filed Vitals:   06/22/12 1130  BP: 105/52  Pulse: 69  Temp:   Resp: 24    Post vital signs: Reviewed  Level of consciousness: awake  Complications: No apparent anesthesia complications

## 2012-06-23 ENCOUNTER — Encounter (HOSPITAL_COMMUNITY): Payer: Self-pay | Admitting: Family Medicine

## 2012-06-23 LAB — CBC
MCH: 23.5 pg — ABNORMAL LOW (ref 26.0–34.0)
MCHC: 30 g/dL (ref 30.0–36.0)
MCV: 78.3 fL (ref 78.0–100.0)
Platelets: 269 10*3/uL (ref 150–400)
RDW: 15.3 % (ref 11.5–15.5)

## 2012-06-23 NOTE — Progress Notes (Signed)
UR chart review completed.  

## 2012-06-23 NOTE — Progress Notes (Signed)
Post Partum Day 1 followig RLTCS. This is her 3rd c-section.  Subjective: no complaints, voiding, tolerating PO and + flatus Patient has been ambulating to the bathroom. She reports that she is not having a lot of pain; mostly pain is when she gets up to walk. She reports moderate bleeding. She reports no fever, chills, CP, SOB, LE edema. She is breastfeeding and reports that that is going well.   Objective: Blood pressure 118/71, pulse 69, temperature 97.5 F (36.4 C), temperature source Oral, resp. rate 20, weight 193.686 kg (427 lb), last menstrual period 09/23/2011, SpO2 98.00%, unknown if currently breastfeeding.  Physical Exam:  General: alert, cooperative and no distress Cardio: normal rate. No murmurs, rubs, or gallops. 2+ DP pulses bilateral. Pulm: Normal rate. No respiratory distress. No wheezes, rales, or rhonchi.  Lochia: appropriate Uterine Fundus: firm Incision: healing well, no dehiscence, no significant erythema; wound vac in place DVT Evaluation: No evidence of DVT seen on physical exam. Negative Homan's sign. No cords or calf tenderness. No significant calf/ankle edema.   Basename 06/23/12 0545  HGB 8.1*  HCT 27.0*    Assessment/Plan: Breastfeeding and Contraception nexplanon Patient is doing well this morning.    LOS: 1 day   Winfield Cunas 06/23/2012, 7:33 AM

## 2012-06-23 NOTE — Progress Notes (Signed)
I have seen and examined this patient and I agree with the above.  Anticipate d/c in AM 10/3. Cam Hai 8:02 AM 06/23/2012

## 2012-06-24 MED ORDER — OXYCODONE-ACETAMINOPHEN 5-325 MG PO TABS: 1.0000 | ORAL_TABLET | ORAL | Status: DC | PRN

## 2012-06-24 MED ORDER — IBUPROFEN 600 MG PO TABS: 600.0000 mg | ORAL_TABLET | Freq: Four times a day (QID) | ORAL | Status: DC

## 2012-06-24 NOTE — Discharge Summary (Addendum)
Obstetric Discharge Summary Reason for Admission: cesarean section Prenatal Procedures: none Intrapartum Procedures: cesarean: low cervical, transverse and wound vac Wound vac was placed due to patient's high BMI and history of multiple c-sections.  Postpartum Procedures: none Complications-Operative and Postpartum: none Hemoglobin  Date Value Range Status  06/23/2012 8.1* 12.0 - 15.0 g/dL Final  12/25/4096 9.0   Final     HCT  Date Value Range Status  06/23/2012 27.0* 36.0 - 46.0 % Final  02/25/2012 29   Final    Physical Exam:  General: alert, cooperative, no distress and morbidly obese Cardio: normal rate Pulm: normal rate, no respiratory distress Lochia: appropriate Uterine Fundus: firm Incision: healing well, no significant drainage, no dehiscence, no significant erythema, wound vac in place. DVT Evaluation: No evidence of DVT seen on physical exam. Negative Homan's sign. No cords or calf tenderness. No significant calf/ankle edema.  Discharge Diagnoses: Post-op c-section delivery.  Discharge Information: Date: 06/24/2012 Activity: pelvic rest Diet: routine Medications: Ibuprofen, Colace and Percocet Condition: stable Instructions: refer to practice specific booklet Discharge to: home  Wound vac: Wound vac will be removed prior to discharge.    Newborn Data: Live born female  Birth Weight: 6 lb 10.9 oz (3030 g) APGAR: 8, 9  Home with mother.  Winfield Cunas 06/24/2012, 8:53 AM

## 2012-06-24 NOTE — Discharge Summary (Signed)
Seen also by me Wound vac to be removed Appointment given for post op visit. Agree with note  Wynelle Bourgeois CNM

## 2012-06-24 NOTE — Progress Notes (Deleted)
  Post Partum Day 2 followig RLTCS. This is her 3rd c-section.  Subjective: no complaints, voiding, tolerating PO and + flatus She has had a BM. Patient has been ambulating to the bathroom. She remains tender at her incision site. She reports moderate bleeding. She reports no fever, chills, CP, SOB, LE edema. She is breastfeeding and reports that that is going well.   Wound vac- Patient has a wound vac place at the incision site. She would like to go home today if she can, but does not want to go home with the wound vac.   Objective: Blood pressure 144/80, pulse 99, temperature 98.6 F (37 C), temperature source Oral, resp. rate 20, weight 193.686 kg (427 lb), last menstrual period 09/23/2011, SpO2 98.00%, unknown if currently breastfeeding.  Physical Exam:  General: alert, cooperative and no distress Cardio: normal rate. 2+ DP pulses bilateral. Pulm: Normal rate. No respiratory distress. Lochia: appropriate Uterine Fundus: firm Incision: healing well, no dehiscence, no significant erythema; wound vac in place DVT Evaluation: No evidence of DVT seen on physical exam. Negative Homan's sign. No cords or calf tenderness. No significant calf/ankle edema.   Basename 06/23/12 0545  HGB 8.1*  HCT 27.0*   . Assessment/Plan: Breastfeeding and Contraception nexplanon Patient is doing well this morning.  Wound vac after care will need to be worked out before she is able to go home. Patient would like to go home today if possible, but does not want to go home with wound vac.     LOS: 2 days   Winfield Cunas 06/24/2012, 7:42 AM

## 2012-06-25 LAB — TYPE AND SCREEN
ABO/RH(D): B POS
Antibody Screen: NEGATIVE
Unit division: 0
Unit division: 0

## 2012-07-14 ENCOUNTER — Encounter (HOSPITAL_COMMUNITY): Payer: Self-pay | Admitting: Anesthesiology

## 2012-07-14 ENCOUNTER — Encounter (HOSPITAL_COMMUNITY): Payer: Self-pay | Admitting: General Surgery

## 2012-07-14 ENCOUNTER — Observation Stay (HOSPITAL_COMMUNITY)
Admission: EM | Admit: 2012-07-14 | Discharge: 2012-07-16 | Disposition: A | Discharge status: Home / Self Care | Payer: Medicaid Other | Attending: Emergency Medicine | Admitting: Emergency Medicine

## 2012-07-14 ENCOUNTER — Encounter (HOSPITAL_COMMUNITY)
Admission: EM | Disposition: A | Discharge status: Home / Self Care | Payer: Self-pay | Source: Home / Self Care | Attending: Emergency Medicine

## 2012-07-14 ENCOUNTER — Emergency Department (HOSPITAL_COMMUNITY): Payer: Medicaid Other

## 2012-07-14 ENCOUNTER — Observation Stay (HOSPITAL_COMMUNITY): Payer: Medicaid Other | Admitting: Anesthesiology

## 2012-07-14 DIAGNOSIS — K8010 Calculus of gallbladder with chronic cholecystitis without obstruction: Secondary | ICD-10-CM

## 2012-07-14 DIAGNOSIS — I10 Essential (primary) hypertension: Secondary | ICD-10-CM | POA: Insufficient documentation

## 2012-07-14 DIAGNOSIS — E876 Hypokalemia: Secondary | ICD-10-CM | POA: Insufficient documentation

## 2012-07-14 DIAGNOSIS — K8020 Calculus of gallbladder without cholecystitis without obstruction: Principal | ICD-10-CM | POA: Insufficient documentation

## 2012-07-14 HISTORY — PX: CHOLECYSTECTOMY: SHX55

## 2012-07-14 LAB — URINE MICROSCOPIC-ADD ON

## 2012-07-14 LAB — URINALYSIS, ROUTINE W REFLEX MICROSCOPIC
Bilirubin Urine: NEGATIVE
Glucose, UA: NEGATIVE mg/dL
Nitrite: NEGATIVE
Protein, ur: 30 mg/dL — AB
Specific Gravity, Urine: 1.017 (ref 1.005–1.030)
Urobilinogen, UA: 1 mg/dL (ref 0.0–1.0)
pH: 7 (ref 5.0–8.0)

## 2012-07-14 LAB — CBC WITH DIFFERENTIAL/PLATELET
Basophils Absolute: 0 10*3/uL (ref 0.0–0.1)
Basophils Relative: 0 % (ref 0–1)
Eosinophils Absolute: 0 10*3/uL (ref 0.0–0.7)
MCH: 23 pg — ABNORMAL LOW (ref 26.0–34.0)
MCHC: 29.7 g/dL — ABNORMAL LOW (ref 30.0–36.0)
Monocytes Relative: 2 % — ABNORMAL LOW (ref 3–12)
Neutrophils Relative %: 86 % — ABNORMAL HIGH (ref 43–77)
Platelets: 394 10*3/uL (ref 150–400)
RDW: 14.7 % (ref 11.5–15.5)

## 2012-07-14 LAB — COMPREHENSIVE METABOLIC PANEL
Albumin: 3.6 g/dL (ref 3.5–5.2)
Alkaline Phosphatase: 89 U/L (ref 39–117)
BUN: 4 mg/dL — ABNORMAL LOW (ref 6–23)
Potassium: 2.7 mEq/L — CL (ref 3.5–5.1)
Sodium: 139 mEq/L (ref 135–145)
Total Protein: 8.3 g/dL (ref 6.0–8.3)

## 2012-07-14 LAB — LIPASE, BLOOD: Lipase: 16 U/L (ref 11–59)

## 2012-07-14 SURGERY — LAPAROSCOPIC CHOLECYSTECTOMY
Anesthesia: General | Site: Abdomen | Wound class: Contaminated

## 2012-07-14 MED ORDER — HYDROMORPHONE HCL PF 1 MG/ML IJ SOLN
0.2500 mg | INTRAMUSCULAR | Status: DC | PRN
  Administered 2012-07-14: 0.5 mg via INTRAVENOUS

## 2012-07-14 MED ORDER — DEXTROSE 5 % IV SOLN
3.0000 g | Freq: Once | INTRAVENOUS | Status: AC
  Administered 2012-07-14 (×2): 3 g via INTRAVENOUS
  Filled 2012-07-14: qty 3000

## 2012-07-14 MED ORDER — ACETAMINOPHEN 10 MG/ML IV SOLN: 1000.0000 mg | Freq: Once | INTRAVENOUS | Status: DC | PRN

## 2012-07-14 MED ORDER — ACETAMINOPHEN 10 MG/ML IV SOLN
INTRAVENOUS | Status: DC | PRN
  Administered 2012-07-14: 1000 mg via INTRAVENOUS

## 2012-07-14 MED ORDER — ONDANSETRON HCL 4 MG PO TABS: 4.0000 mg | ORAL_TABLET | Freq: Four times a day (QID) | ORAL | Status: DC

## 2012-07-14 MED ORDER — KCL IN DEXTROSE-NACL 20-5-0.45 MEQ/L-%-% IV SOLN
INTRAVENOUS | Status: AC
  Filled 2012-07-14: qty 1000

## 2012-07-14 MED ORDER — ONDANSETRON HCL 4 MG/2ML IJ SOLN
4.0000 mg | Freq: Once | INTRAMUSCULAR | Status: AC
  Administered 2012-07-14: 4 mg via INTRAVENOUS
  Filled 2012-07-14: qty 2

## 2012-07-14 MED ORDER — CEFAZOLIN SODIUM-DEXTROSE 2-3 GM-% IV SOLR
INTRAVENOUS | Status: AC
  Filled 2012-07-14: qty 50

## 2012-07-14 MED ORDER — PROPOFOL 10 MG/ML IV BOLUS
INTRAVENOUS | Status: DC | PRN
  Administered 2012-07-14: 200 mg via INTRAVENOUS

## 2012-07-14 MED ORDER — MORPHINE SULFATE 4 MG/ML IJ SOLN
4.0000 mg | INTRAMUSCULAR | Status: DC | PRN
  Administered 2012-07-14 – 2012-07-15 (×6): 4 mg via INTRAVENOUS
  Filled 2012-07-14 (×6): qty 1

## 2012-07-14 MED ORDER — SODIUM CHLORIDE 0.9 % IV SOLN
80.0000 mg | Freq: Once | INTRAVENOUS | Status: AC
  Administered 2012-07-14: 80 mg via INTRAVENOUS
  Filled 2012-07-14: qty 80

## 2012-07-14 MED ORDER — FENTANYL CITRATE 0.05 MG/ML IJ SOLN
100.0000 ug | Freq: Once | INTRAMUSCULAR | Status: AC
  Administered 2012-07-14: 100 ug via INTRAVENOUS
  Filled 2012-07-14: qty 2

## 2012-07-14 MED ORDER — PANTOPRAZOLE SODIUM 40 MG IV SOLR
40.0000 mg | Freq: Every day | INTRAVENOUS | Status: DC
  Administered 2012-07-14 – 2012-07-15 (×2): 40 mg via INTRAVENOUS
  Filled 2012-07-14 (×3): qty 40

## 2012-07-14 MED ORDER — NEOSTIGMINE METHYLSULFATE 1 MG/ML IJ SOLN
INTRAMUSCULAR | Status: DC | PRN
  Administered 2012-07-14: 5 mg via INTRAVENOUS

## 2012-07-14 MED ORDER — ONDANSETRON HCL 4 MG/2ML IJ SOLN
INTRAMUSCULAR | Status: DC | PRN
  Administered 2012-07-14: 4 mg via INTRAVENOUS

## 2012-07-14 MED ORDER — MEPERIDINE HCL 50 MG/ML IJ SOLN: 6.2500 mg | INTRAMUSCULAR | Status: DC | PRN

## 2012-07-14 MED ORDER — ACETAMINOPHEN 10 MG/ML IV SOLN
INTRAVENOUS | Status: AC
  Filled 2012-07-14: qty 100

## 2012-07-14 MED ORDER — PROMETHAZINE HCL 25 MG PO TABS: 25.0000 mg | ORAL_TABLET | Freq: Four times a day (QID) | ORAL | Status: DC | PRN

## 2012-07-14 MED ORDER — OXYCODONE HCL 5 MG/5ML PO SOLN
5.0000 mg | Freq: Once | ORAL | Status: DC | PRN
  Filled 2012-07-14: qty 5

## 2012-07-14 MED ORDER — OXYCODONE-ACETAMINOPHEN 5-325 MG PO TABS: 1.0000 | ORAL_TABLET | ORAL | Status: DC | PRN

## 2012-07-14 MED ORDER — FENTANYL CITRATE 0.05 MG/ML IJ SOLN
INTRAMUSCULAR | Status: DC | PRN
  Administered 2012-07-14: 100 ug via INTRAVENOUS
  Administered 2012-07-14 (×3): 50 ug via INTRAVENOUS

## 2012-07-14 MED ORDER — SUCCINYLCHOLINE CHLORIDE 20 MG/ML IJ SOLN
INTRAMUSCULAR | Status: DC | PRN
  Administered 2012-07-14: 160 mg via INTRAVENOUS

## 2012-07-14 MED ORDER — BUPIVACAINE-EPINEPHRINE 0.25% -1:200000 IJ SOLN
INTRAMUSCULAR | Status: AC
  Filled 2012-07-14: qty 1

## 2012-07-14 MED ORDER — MIDAZOLAM HCL 5 MG/5ML IJ SOLN
INTRAMUSCULAR | Status: DC | PRN
  Administered 2012-07-14: 2 mg via INTRAVENOUS

## 2012-07-14 MED ORDER — ONDANSETRON HCL 4 MG/2ML IJ SOLN: 4.0000 mg | Freq: Four times a day (QID) | INTRAMUSCULAR | Status: DC | PRN

## 2012-07-14 MED ORDER — LACTATED RINGERS IR SOLN
Status: DC | PRN
  Administered 2012-07-14: 1

## 2012-07-14 MED ORDER — IOHEXOL 300 MG/ML  SOLN
INTRAMUSCULAR | Status: AC
  Filled 2012-07-14: qty 1

## 2012-07-14 MED ORDER — HYDROMORPHONE HCL PF 1 MG/ML IJ SOLN
1.0000 mg | Freq: Once | INTRAMUSCULAR | Status: AC
  Administered 2012-07-14: 1 mg via INTRAVENOUS
  Filled 2012-07-14: qty 1

## 2012-07-14 MED ORDER — BUPIVACAINE-EPINEPHRINE 0.25% -1:200000 IJ SOLN
INTRAMUSCULAR | Status: DC | PRN
  Administered 2012-07-14: 20 mL
  Administered 2012-07-14: 10 mL

## 2012-07-14 MED ORDER — HYDROCODONE-ACETAMINOPHEN 5-325 MG PO TABS
1.0000 | ORAL_TABLET | ORAL | Status: DC | PRN
  Administered 2012-07-14 (×2): 2 via ORAL
  Administered 2012-07-15: 1 via ORAL
  Administered 2012-07-15 (×2): 2 via ORAL
  Filled 2012-07-14: qty 2
  Filled 2012-07-14: qty 1
  Filled 2012-07-14 (×3): qty 2

## 2012-07-14 MED ORDER — GLYCOPYRROLATE 0.2 MG/ML IJ SOLN
INTRAMUSCULAR | Status: DC | PRN
  Administered 2012-07-14: .9 mg via INTRAVENOUS

## 2012-07-14 MED ORDER — OXYCODONE HCL 5 MG PO TABS: 5.0000 mg | ORAL_TABLET | Freq: Once | ORAL | Status: DC | PRN

## 2012-07-14 MED ORDER — CEFAZOLIN SODIUM 1-5 GM-% IV SOLN
INTRAVENOUS | Status: AC
  Filled 2012-07-14: qty 50

## 2012-07-14 MED ORDER — ROCURONIUM BROMIDE 100 MG/10ML IV SOLN
INTRAVENOUS | Status: DC | PRN
  Administered 2012-07-14: 40 mg via INTRAVENOUS
  Administered 2012-07-14: 10 mg via INTRAVENOUS

## 2012-07-14 MED ORDER — LABETALOL HCL 5 MG/ML IV SOLN
INTRAVENOUS | Status: DC | PRN
  Administered 2012-07-14: 5 mg via INTRAVENOUS

## 2012-07-14 MED ORDER — MIDAZOLAM HCL 2 MG/2ML IJ SOLN
1.0000 mg | INTRAMUSCULAR | Status: DC | PRN
  Administered 2012-07-14: 1 mg via INTRAVENOUS

## 2012-07-14 MED ORDER — KCL IN DEXTROSE-NACL 20-5-0.9 MEQ/L-%-% IV SOLN
INTRAVENOUS | Status: DC
  Administered 2012-07-14 – 2012-07-15 (×3): via INTRAVENOUS
  Filled 2012-07-14 (×3): qty 1000

## 2012-07-14 MED ORDER — MIDAZOLAM HCL 2 MG/2ML IJ SOLN
INTRAMUSCULAR | Status: AC
  Filled 2012-07-14: qty 2

## 2012-07-14 MED ORDER — PROMETHAZINE HCL 25 MG/ML IJ SOLN: 6.2500 mg | INTRAMUSCULAR | Status: DC | PRN

## 2012-07-14 MED ORDER — 0.9 % SODIUM CHLORIDE (POUR BTL) OPTIME
TOPICAL | Status: DC | PRN
  Administered 2012-07-14: 1000 mL

## 2012-07-14 MED ORDER — HYDROMORPHONE HCL PF 1 MG/ML IJ SOLN
INTRAMUSCULAR | Status: AC
  Administered 2012-07-14: 15:00:00
  Filled 2012-07-14: qty 1

## 2012-07-14 MED ORDER — LACTATED RINGERS IV SOLN
INTRAVENOUS | Status: DC
  Administered 2012-07-14: 1000 mL via INTRAVENOUS

## 2012-07-14 MED ORDER — LIDOCAINE HCL (CARDIAC) 20 MG/ML IV SOLN
INTRAVENOUS | Status: DC | PRN
  Administered 2012-07-14: 50 mg via INTRAVENOUS

## 2012-07-14 MED ORDER — POTASSIUM CHLORIDE 10 MEQ/100ML IV SOLN
10.0000 meq | INTRAVENOUS | Status: AC
  Administered 2012-07-14 (×3): 10 meq via INTRAVENOUS
  Filled 2012-07-14 (×3): qty 100

## 2012-07-14 SURGICAL SUPPLY — 39 items
ADH SKN CLS APL DERMABOND .7 (GAUZE/BANDAGES/DRESSINGS) ×2
APPLIER CLIP ROT 10 11.4 M/L (STAPLE) ×3
APR CLP MED LRG 11.4X10 (STAPLE) ×2
BAG SPEC RTRVL LRG 6X4 10 (ENDOMECHANICALS) ×2
CANISTER SUCTION 2500CC (MISCELLANEOUS) ×3 IMPLANT
CATH REDDICK CHOLANGI 4FR 50CM (CATHETERS) ×3 IMPLANT
CLIP APPLIE ROT 10 11.4 M/L (STAPLE) ×2 IMPLANT
CLOTH BEACON ORANGE TIMEOUT ST (SAFETY) ×3 IMPLANT
COVER MAYO STAND STRL (DRAPES) ×3 IMPLANT
DECANTER SPIKE VIAL GLASS SM (MISCELLANEOUS) ×3 IMPLANT
DERMABOND ADVANCED (GAUZE/BANDAGES/DRESSINGS) ×1
DERMABOND ADVANCED .7 DNX12 (GAUZE/BANDAGES/DRESSINGS) ×2 IMPLANT
DRAPE C-ARM 42X72 X-RAY (DRAPES) ×3 IMPLANT
DRAPE LAPAROSCOPIC ABDOMINAL (DRAPES) ×3 IMPLANT
DRAPE UTILITY XL STRL (DRAPES) ×5 IMPLANT
ELECT REM PT RETURN 9FT ADLT (ELECTROSURGICAL) ×3
ELECTRODE REM PT RTRN 9FT ADLT (ELECTROSURGICAL) ×2 IMPLANT
GLOVE BIO SURGEON STRL SZ7.5 (GLOVE) ×6 IMPLANT
GLOVE BIOGEL PI IND STRL 7.0 (GLOVE) ×2 IMPLANT
GLOVE BIOGEL PI INDICATOR 7.0 (GLOVE) ×1
GOWN PREVENTION PLUS XLARGE (GOWN DISPOSABLE) ×5 IMPLANT
GOWN STRL NON-REIN LRG LVL3 (GOWN DISPOSABLE) ×3 IMPLANT
GOWN STRL REIN XL XLG (GOWN DISPOSABLE) ×7 IMPLANT
HEMOSTAT SURGICEL 4X8 (HEMOSTASIS) ×3 IMPLANT
IV CATH 14GX2 1/4 (CATHETERS) ×3 IMPLANT
KIT BASIN OR (CUSTOM PROCEDURE TRAY) ×3 IMPLANT
POUCH SPECIMEN RETRIEVAL 10MM (ENDOMECHANICALS) ×2 IMPLANT
SET IRRIG TUBING LAPAROSCOPIC (IRRIGATION / IRRIGATOR) ×3 IMPLANT
SOLUTION ANTI FOG 6CC (MISCELLANEOUS) ×3 IMPLANT
SUT MNCRL AB 4-0 PS2 18 (SUTURE) ×3 IMPLANT
SUT VICRYL 0 UR6 27IN ABS (SUTURE) ×2 IMPLANT
TOWEL OR 17X26 10 PK STRL BLUE (TOWEL DISPOSABLE) ×9 IMPLANT
TRAY LAP CHOLE (CUSTOM PROCEDURE TRAY) ×3 IMPLANT
TROCAR BLADELESS OPT 5 100 (ENDOMECHANICALS) ×4 IMPLANT
TROCAR BLADELESS OPT 5 150 (ENDOMECHANICALS) ×2 IMPLANT
TROCAR BLADELESS OPT 5 75 (ENDOMECHANICALS) ×2 IMPLANT
TROCAR XCEL BLUNT TIP 100MML (ENDOMECHANICALS) ×3 IMPLANT
TROCAR XCEL NON-BLD 11X100MML (ENDOMECHANICALS) ×3 IMPLANT
TUBING INSUFFLATION 10FT LAP (TUBING) ×3 IMPLANT

## 2012-07-14 NOTE — H&P (Signed)
Sarah Nash is an 31 y.o. female.   Chief Complaint: abd pain HPI: 31 yo bf who is 3 weeks out from c-section developed upper abdominal pain yesterday. The pain is associated with nausea and vomiting. Pain has not let up. Denies fever or chills.  Past Medical History  Diagnosis Date  . Depression   . Obesity   . Abnormal Pap smear 2006  . Chlamydia april 2013  . Pregnancy induced hypertension     "they are watching my b/p"    Past Surgical History  Procedure Date  . Cesarean section [id number]  . Cesarean section 06/22/2012    Procedure: CESAREAN SECTION;  Surgeon: Reva Bores, MD;  Location: [facility];  Service: Obstetrics;  Laterality: N/A;    Family History  Problem Relation Age of Onset  . Anesthesia problems Neg Hx   . Hypertension Father   . Mental illness Father    Social History:  reports that she quit smoking about 9 months ago. She has never used smokeless tobacco. She reports that she does not drink alcohol or use illicit drugs.  Allergies: No Known Allergies   (Not in a hospital admission)  Results for orders placed during the hospital encounter of 07/14/12 (from the past 48 hour(s))  URINALYSIS, ROUTINE W REFLEX MICROSCOPIC     Status: Abnormal   Collection Time   07/14/12  3:51 AM      Component Value Range Comment   Color, Urine YELLOW  YELLOW    APPearance CLOUDY (*) CLEAR    Specific Gravity, Urine 1.017  1.005 - 1.030    pH 7.0  5.0 - 8.0    Glucose, UA NEGATIVE  NEGATIVE mg/dL    Hgb urine dipstick LARGE (*) NEGATIVE    Bilirubin Urine NEGATIVE  NEGATIVE    Ketones, ur TRACE (*) NEGATIVE mg/dL    Protein, ur 30 (*) NEGATIVE mg/dL    Urobilinogen, UA 1.0  0.0 - 1.0 mg/dL    Nitrite NEGATIVE  NEGATIVE    Leukocytes, UA MODERATE (*) NEGATIVE   URINE MICROSCOPIC-ADD ON     Status: Abnormal   Collection Time   07/14/12  3:51 AM      Component Value Range Comment   Squamous Epithelial / LPF FEW (*) RARE    WBC, UA 7-10  <3 WBC/hpf    RBC / HPF TOO  NUMEROUS TO COUNT  <3 RBC/hpf    Bacteria, UA FEW (*) RARE    Urine-Other MUCOUS PRESENT     CBC WITH DIFFERENTIAL     Status: Abnormal   Collection Time   07/14/12  5:07 AM      Component Value Range Comment   WBC 10.4  4.0 - 10.5 K/uL    RBC 4.47  3.87 - 5.11 MIL/uL    Hemoglobin 10.3 (*) 12.0 - 15.0 g/dL    HCT 45.4 (*) 09.8 - 46.0 %    MCV 77.6 (*) 78.0 - 100.0 fL    MCH 23.0 (*) 26.0 - 34.0 pg    MCHC 29.7 (*) 30.0 - 36.0 g/dL    RDW 11.9  14.7 - 82.9 %    Platelets 394  150 - 400 K/uL    Neutrophils Relative 86 (*) 43 - 77 %    Neutro Abs 8.9 (*) 1.7 - 7.7 K/uL    Lymphocytes Relative 12  12 - 46 %    Lymphs Abs 1.2  0.7 - 4.0 K/uL    Monocytes Relative 2 (*)  3 - 12 %    Monocytes Absolute 0.2  0.1 - 1.0 K/uL    Eosinophils Relative 0  0 - 5 %    Eosinophils Absolute 0.0  0.0 - 0.7 K/uL    Basophils Relative 0  0 - 1 %    Basophils Absolute 0.0  0.0 - 0.1 K/uL   COMPREHENSIVE METABOLIC PANEL     Status: Abnormal   Collection Time   07/14/12  5:07 AM      Component Value Range Comment   Sodium 139  135 - 145 mEq/L    Potassium 2.7 (*) 3.5 - 5.1 mEq/L    Chloride 103  96 - 112 mEq/L    CO2 25  19 - 32 mEq/L    Glucose, Bld 110 (*) 70 - 99 mg/dL    BUN 4 (*) 6 - 23 mg/dL    Creatinine, Ser 1.61  0.50 - 1.10 mg/dL    Calcium 8.9  8.4 - 09.6 mg/dL    Total Protein 8.3  6.0 - 8.3 g/dL    Albumin 3.6  3.5 - 5.2 g/dL    AST 15  0 - 37 U/L    ALT 13  0 - 35 U/L    Alkaline Phosphatase 89  39 - 117 U/L    Total Bilirubin 0.2 (*) 0.3 - 1.2 mg/dL    GFR calc non Af Amer >90  >90 mL/min    GFR calc Af Amer >90  >90 mL/min   LIPASE, BLOOD     Status: Normal   Collection Time   07/14/12  5:07 AM      Component Value Range Comment   Lipase 16  11 - 59 U/L    US Abdomen Complete  07/14/2012  *RADIOLOGY REPORT*  Clinical Data:  Abdominal pain.  COMPLETE ABDOMINAL ULTRASOUND  Comparison:  None.  Findings:  Gallbladder:  Multiple gallstones are present.  No gallbladder wall  thickening.  Negative sonographic Murphy's sign.  Common bile duct:  Poorly visualized due to bowel gas artifact distally.  Proximally measures up to 5 mm.  Liver:  Heterogeneous/increased in echogenicity.  This limits focal lesion detection.  IVC:  Appears normal.  Pancreas:  No focal abnormality identified.  Portions of the body, neck, and tail are obscured by bowel gas artifact.  Spleen:  Measures up to 9.8 cm oblique.  No focal abnormality.  Right Kidney:  Measures 13.1 cm.  No hydronephrosis or focal abnormality.  Left Kidney:  Measures 12.7 cm.  No hydronephrosis or focal abnormality.  Abdominal aorta:  Poorly visualized due to bowel gas artifact and patient body habitus.  Measures up to 2.4 cm where seen. The distal aorta and bifurcation are obscured.  IMPRESSION: Cholelithiasis without sonographic evidence for cholecystitis.  Heterogeneous/increased liver echogenicity suggests hepatic steatosis.  Technically challenging examination due to bowel gas artifact and patient body habitus.  Distal CBD and portions of the pancreas and aorta are obscured.   Original Report Authenticated By: Waneta Martins, M.D.     Review of Systems  Constitutional: Negative.   HENT: Negative.   Eyes: Negative.   Respiratory: Negative.   Cardiovascular: Negative.   Gastrointestinal: Positive for nausea, vomiting and abdominal pain.  Genitourinary: Negative.   Musculoskeletal: Negative.   Skin: Negative.   Neurological: Negative.   Endo/Heme/Allergies: Negative.   Psychiatric/Behavioral: Negative.     Blood pressure 154/93, pulse 73, temperature 98.1 F (36.7 C), temperature source Oral, resp. rate 18, SpO2 97.00%, unknown if currently  breastfeeding. Physical Exam  Constitutional: She is oriented to person, place, and time.       Morbidly obese bf  HENT:  Head: Normocephalic and atraumatic.  Eyes: Conjunctivae normal and EOM are normal. Pupils are equal, round, and reactive to light.  Neck: Normal range  of motion. Neck supple.  Cardiovascular: Normal rate, regular rhythm and normal heart sounds.   Respiratory: Effort normal and breath sounds normal.  GI: Soft. Bowel sounds are normal.       Epigastric and RUQ tenderness. No peritonitis. No palpable mass but she is morbidly obese. Recent c section  Musculoskeletal: Normal range of motion.  Neurological: She is alert and oriented to person, place, and time.  Skin: Skin is warm and dry.  Psychiatric: She has a normal mood and affect. Her behavior is normal.     Assessment/Plan U/S shows stones in gallbladder but no ductal dilation or gallbladder wall thickening. LFT's are normal. I think she has symptomatic gallstones and would benefit from having her gallbladder removed. I have discussed the risks and benefits of surgery with her as well as some of the technical aspects and she understands and wishes to proceed. Will admit her and plan for surgery as the schedule allows  TOTH III,Landers Prajapati S 07/14/2012, 9:34 AM

## 2012-07-14 NOTE — Anesthesia Postprocedure Evaluation (Signed)
Anesthesia Post Note  Patient: Sarah Nash  Procedure(s) Performed: Procedure(s) (LRB): LAPAROSCOPIC CHOLECYSTECTOMY (N/A)  Anesthesia type: General  Patient location: PACU  Post pain: Pain level controlled  Post assessment: Post-op Vital signs reviewed  Last Vitals:  Filed Vitals:   07/14/12 1612  BP: 157/78  Pulse: 73  Temp: 36.6 C  Resp: 18    Post vital signs: Reviewed  Level of consciousness: sedated  Complications: No apparent anesthesia complications

## 2012-07-14 NOTE — ED Provider Notes (Addendum)
History     CSN: [id number]  Arrival date & time 07/14/12  4696   First MD Initiated Contact with Patient 07/14/12 [phone]      Chief Complaint  Patient presents with  . Abdominal Pain    (Consider location/radiation/quality/duration/timing/severity/associated sxs/prior treatment) HPI 31 year old female who is one month postpartum presents to emergency room complaining of upper abdominal pain starting this evening around 10 PM. Pain radiates into her back. She's had nausea and vomiting. She is taking Percocet at home without improvement in symptoms. No prior history of same. Patient is breast-feeding. She reports she is still having some vaginal spotting postpartum, the incision is good. She has had no fevers. No prior history of similar symptoms. She does report history of reflux but reports this pain and are nothing like her reflux. No urinary symptoms  Past Medical History  Diagnosis Date  . Depression   . Obesity   . Abnormal Pap smear 2006  . Chlamydia april 2013  . Pregnancy induced hypertension     "they are watching my b/p"    Past Surgical History  Procedure Date  . Cesarean section [id number]  . Cesarean section 06/22/2012    Procedure: CESAREAN SECTION;  Surgeon: Reva Bores, MD;  Location: [facility];  Service: Obstetrics;  Laterality: N/A;    Family History  Problem Relation Age of Onset  . Anesthesia problems Neg Hx   . Hypertension Father   . Mental illness Father     History  Substance Use Topics  . Smoking status: Former Smoker    Quit date: 09/27/2011  . Smokeless tobacco: Never Used  . Alcohol Use: No    OB History    Grav Para Term Preterm Abortions TAB SAB Ect Mult Living   3 3 3       3       Review of Systems  All other systems reviewed and are negative.    Allergies  Review of patient's allergies indicates no known allergies.  Home Medications   Current Outpatient Rx  Name Route Sig Dispense Refill  . IBUPROFEN 600 MG PO TABS Oral  Take 1 tablet (600 mg total) by mouth every 6 (six) hours. 30 tablet 1  . OXYCODONE-ACETAMINOPHEN 5-325 MG PO TABS Oral Take 1-2 tablets by mouth every 4 (four) hours as needed (moderate - severe pain). 50 tablet 0    BP 158/89  Pulse 67  Resp 22  SpO2 100%  Breastfeeding? Unknown  Physical Exam  Nursing note and vitals reviewed. Constitutional: She appears distressed (Uncomfortable appearing, rocking on the bed).       Morbidly obese  HENT:  Head: Normocephalic and atraumatic.  Nose: Nose normal.  Mouth/Throat: Oropharynx is clear and moist.  Neck: Normal range of motion. Neck supple. No JVD present. No tracheal deviation present. No thyromegaly present.  Cardiovascular: Normal rate, regular rhythm, normal heart sounds and intact distal pulses.  Exam reveals no gallop and no friction rub.   No murmur heard. Pulmonary/Chest: Effort normal and breath sounds normal. No stridor. No respiratory distress. She has no wheezes. She has no rales. She exhibits no tenderness.  Abdominal: Soft. She exhibits no distension. There is tenderness (tenderness to palpation across entire upper-outer and worse in the epigastrium and right upper quadrant). There is no rebound and no guarding.       Bowel sounds decreased and distant given body habitus  Musculoskeletal: Normal range of motion. She exhibits no edema and no tenderness.  Lymphadenopathy:  She has no cervical adenopathy.  Skin: Skin is warm and dry. No rash noted. No erythema. No pallor.    ED Course  Procedures (including critical care time)  Labs Reviewed  CBC WITH DIFFERENTIAL - Abnormal; Notable for the following:    Hemoglobin 10.3 (*)     HCT 34.7 (*)     MCV 77.6 (*)     MCH 23.0 (*)     MCHC 29.7 (*)     Neutrophils Relative 86 (*)     Neutro Abs 8.9 (*)     Monocytes Relative 2 (*)     All other components within normal limits  URINALYSIS, ROUTINE W REFLEX MICROSCOPIC - Abnormal; Notable for the following:    APPearance  CLOUDY (*)     Hgb urine dipstick LARGE (*)     Ketones, ur TRACE (*)     Protein, ur 30 (*)     Leukocytes, UA MODERATE (*)     All other components within normal limits  URINE MICROSCOPIC-ADD ON - Abnormal; Notable for the following:    Squamous Epithelial / LPF FEW (*)     Bacteria, UA FEW (*)     All other components within normal limits  COMPREHENSIVE METABOLIC PANEL  LIPASE, BLOOD   US Abdomen Complete  07/14/2012  *RADIOLOGY REPORT*  Clinical Data:  Abdominal pain.  COMPLETE ABDOMINAL ULTRASOUND  Comparison:  None.  Findings:  Gallbladder:  Multiple gallstones are present.  No gallbladder wall thickening.  Negative sonographic Murphy's sign.  Common bile duct:  Poorly visualized due to bowel gas artifact distally.  Proximally measures up to 5 mm.  Liver:  Heterogeneous/increased in echogenicity.  This limits focal lesion detection.  IVC:  Appears normal.  Pancreas:  No focal abnormality identified.  Portions of the body, neck, and tail are obscured by bowel gas artifact.  Spleen:  Measures up to 9.8 cm oblique.  No focal abnormality.  Right Kidney:  Measures 13.1 cm.  No hydronephrosis or focal abnormality.  Left Kidney:  Measures 12.7 cm.  No hydronephrosis or focal abnormality.  Abdominal aorta:  Poorly visualized due to bowel gas artifact and patient body habitus.  Measures up to 2.4 cm where seen. The distal aorta and bifurcation are obscured.  IMPRESSION: Cholelithiasis without sonographic evidence for cholecystitis.  Heterogeneous/increased liver echogenicity suggests hepatic steatosis.  Technically challenging examination due to bowel gas artifact and patient body habitus.  Distal CBD and portions of the pancreas and aorta are obscured.   Original Report Authenticated By: Waneta Martins, M.D.      1. Gallstones   2. Hypokalemia       MDM  31 year old female with upper abdominal pain radiating into her back with nausea and vomiting. Gallstones noted on ultrasound without  signs of cold he cystitis. Patient has been taking pain medicines at home which has not relieved symptoms. We'll give: Head and further Zofran. If patient unable to have pain control may discuss with surgery for their evaluation for cholelithiasis        Olivia Mackie, MD 07/14/12 0655  8:12 AM Pt with persistent nausea, little better pain control.  D/w surgery given persistent nausea, they will see in consult.  Olivia Mackie, MD 07/14/12 0813  8:17 AM Pt reports pain returning, will redose dilaudid.  Olivia Mackie, MD 07/14/12 [phone]

## 2012-07-14 NOTE — ED Provider Notes (Addendum)
0820.  Received sign-over from DR. Otter.  Pt is awaiting evaluation by the on-call general surgeon secondary to persistent abdominal pain and gallstones.  Will review the recommendations as available, provide prn symptomatic mgmnt, and an appropriate disposition.  Tobin Chad, MD 07/14/12 0830  Pt has been seen by surgery and will be admitted for further mgmnt.  Tobin Chad, MD 07/14/12 1029

## 2012-07-14 NOTE — ED Notes (Signed)
[facility] in [place] informed that the patient did not have an IV. IV Team had attempted x 3 and writer attempted x 2.

## 2012-07-14 NOTE — Op Note (Signed)
07/14/2012  1:52 PM  PATIENT:  Sarah Nash  31 y.o. female  PRE-OPERATIVE DIAGNOSIS:  cholelithiasis  POST-OPERATIVE DIAGNOSIS:  cholelithiasis  PROCEDURE:  Procedure(s) (LRB) with comments: LAPAROSCOPIC CHOLECYSTECTOMY (N/A)  SURGEON:  Surgeon(s) and Role:    * Robyne Askew, MD - Primary    * Wilmon Arms. Corliss Skains, MD - Assisting  PHYSICIAN ASSISTANT:   ASSISTANTS: Dr. Corliss Skains  ANESTHESIA:   general  EBL:     BLOOD ADMINISTERED:none  DRAINS: none   LOCAL MEDICATIONS USED:  MARCAINE     SPECIMEN:  Source of Specimen:  gallbladder  DISPOSITION OF SPECIMEN:  PATHOLOGY  COUNTS:  YES  TOURNIQUET:  * No tourniquets in log *  DICTATION: .Dragon Dictation @opnoteheader @  Procedure: After informed consent was obtained the patient was brought to the operating room and placed in the supine position on the operating room table. After adequate induction of general anesthesia the patient's abdomen was prepped with ChloraPrep allowed to dry and draped in usual sterile manner. The area below the umbilicus was infiltrated with quarter percent  Marcaine. A small incision was made with a 15 blade knife. The incision was carried down through the subcutaneous tissue bluntly with a hemostat and [employer] retractors. The linea alba was identified. The linea alba was incised with a 15 blade knife and each side was grasped with Coker clamps. The preperitoneal space was then probed with a hemostat until the peritoneum was opened and access was gained to the abdominal cavity. A 0 Vicryl pursestring stitch was placed in the fascia surrounding the opening. A Hassan cannula was then placed through the opening and anchored in place with the previously placed Vicryl purse string stitch. The abdomen was insufflated with carbon dioxide without difficulty. A laparoscope was inserted through the [facility] cannula in the right upper quadrant was inspected. Next the epigastric region was infiltrated with %  Marcaine. A small incision was made with a 15 blade knife. A 10 mm port was placed bluntly through this incision into the abdominal cavity under direct vision. Next 2 sites were chosen laterally on the right side of the abdomen for placement of 5 mm ports. Each of these areas was infiltrated with quarter percent Marcaine. Small stab incisions were made with a 15 blade knife. 5 mm ports were then placed bluntly through these incisions into the abdominal cavity under direct vision without difficulty. A blunt grasper was placed through the lateralmost 5 mm port and used to grasp the dome of the gallbladder and elevated anteriorly and superiorly. Another blunt grasper was placed through the other 5 mm port and used to retract the body and neck of the gallbladder. A dissector was placed through the epigastric port and using the electrocautery the peritoneal reflection at the gallbladder neck was opened. Blunt dissection was then carried out in this area until the gallbladder neck-cystic duct junction was readily identified and a good window was created.3 clips were placed proximally on the cystic duct and one distally and the duct was divided between the 2 sets of clips. Posterior to this the cystic artery was identified and again dissected bluntly in a circumferential manner until a good window  was created. 2 clips were placed proximally and one distally on the artery and the artery was divided between the 2 sets of clips. Next a laparoscopic hook cautery device was used to separate the gallbladder from the liver bed. Prior to completely detaching the gallbladder from the liver bed the liver bed was  inspected and several small bleeding points were coagulated with the electrocautery until the area was completely hemostatic. The gallbladder was then detached the rest of it from the liver bed without difficulty. A laparoscopic bag was inserted through the epigastric port. The gallbladder was placed within the bag and the  bag was sealed. A laparoscope was then moved to the epigastric port. The gallbladder grasper was placed through the [facility] cannula and used to grasp the opening of the bag. The bag with the gallbladder was then removed with the [facility] cannula through the infraumbilical port without difficulty. The fascial defect was then closed with the previously placed Vicryl pursestring stitch as well as with another figure-of-eight 0 Vicryl stitch. The liver bed was inspected again and found to be hemostatic. The abdomen was irrigated with copious amounts of saline until the effluent was clear. The ports were then removed under direct vision without difficulty and were found to be hemostatic. The gas was allowed to escape. The skin incisions were all closed with interrupted 4-0 Monocryl subcuticular stitches. Dermabond dressings were applied. The patient tolerated the procedure well. At the end of the case all needle sponge and instrument counts were correct. The patient was then awakened and taken to recovery in stable condition   PLAN OF CARE: Admit for overnight observation  PATIENT DISPOSITION:  PACU - hemodynamically stable.   Delay start of Pharmacological VTE agent (>24hrs) due to surgical blood loss or risk of bleeding: no

## 2012-07-14 NOTE — Transfer of Care (Signed)
Immediate Anesthesia Transfer of Care Note  Patient: Sarah Nash  Procedure(s) Performed: Procedure(s) (LRB): LAPAROSCOPIC CHOLECYSTECTOMY (N/A)  Patient Location: PACU  Anesthesia Type: General  Level of Consciousness: sedated, patient cooperative and responds to stimulaton  Airway & Oxygen Therapy: Patient Spontanous Breathing and Patient connected to face mask oxgen  Post-op Assessment: Report given to PACU RN and Post -op Vital signs reviewed and stable  Post vital signs: Reviewed and stable  Complications: No apparent anesthesia complications

## 2012-07-14 NOTE — Anesthesia Preprocedure Evaluation (Addendum)
Anesthesia Evaluation  Patient identified by MRN, date of birth, ID band Patient awake    Reviewed: Allergy & Precautions, H&P , NPO status , Patient's Chart, lab work & pertinent test results  Airway Mallampati: II TM Distance: >3 FB Neck ROM: Full    Dental  (+) Dental Advisory Given and Teeth Intact   Pulmonary former smoker,  breath sounds clear to auscultation  Pulmonary exam normal       Cardiovascular hypertension, Pt. on medications Rhythm:Regular Rate:Normal     Neuro/Psych PSYCHIATRIC DISORDERS Depression negative neurological ROS     GI/Hepatic negative GI ROS, Neg liver ROS,   Endo/Other  Morbid obesity  Renal/GU negative Renal ROS     Musculoskeletal negative musculoskeletal ROS (+)   Abdominal (+) + obese,   Peds  Hematology   Anesthesia Other Findings   Reproductive/Obstetrics                        Anesthesia Physical Anesthesia Plan  ASA: III  Anesthesia Plan: General   Post-op Pain Management:    Induction: Intravenous  Airway Management Planned: Oral ETT  Additional Equipment:   Intra-op Plan:   Post-operative Plan: Extubation in OR  Informed Consent: I have reviewed the patients History and Physical, chart, labs and discussed the procedure including the risks, benefits and alternatives for the proposed anesthesia with the patient or authorized representative who has indicated his/her understanding and acceptance.   Dental advisory given  Plan Discussed with: CRNA  Anesthesia Plan Comments:         Anesthesia Quick Evaluation

## 2012-07-14 NOTE — ED Notes (Signed)
[id number]<[id number]> Expected date:<BR> Expected time:<BR> Means of arrival:<BR> Comments:<BR> EMS/flank pain

## 2012-07-14 NOTE — Progress Notes (Signed)
CRITICAL VALUE ALERT  Critical value received:  Potassium 2.7  Date of notification:  07/14/12  Time of notification:  0552  Critical value read back:yes  Nurse who received alert:  S.Young,RN  MD notified (1st page):  Dr. Norlene Campbell  Time of first page: [phone]  MD notified (2nd page):  Time of second page:  Responding MD:  Dr. Norlene Campbell  Time MD responded:  [phone]

## 2012-07-14 NOTE — ED Notes (Signed)
Pt complains of bilateral upper quadrant abdominal pain that does not radiate. Pt also complains of lumbar pain in her back.  Pt complained of N/V   about five hours ago around 10-1030pm. Took percocet at home for pain, which was given for her C-section on 06/22/12, with no relief form pain medications and has  vomitted multiple times afterwards. Currently rating pain 10/10/. 2L N/C givem.20g in the LAC, of fluid and 4mg  of zofran given per ems with improvement.

## 2012-07-15 LAB — BASIC METABOLIC PANEL
CO2: 27 mEq/L (ref 19–32)
Calcium: 8.9 mg/dL (ref 8.4–10.5)
Creatinine, Ser: 0.8 mg/dL (ref 0.50–1.10)
GFR calc Af Amer: >90 mL/min (ref >90)
GFR calc non Af Amer: >90 mL/min (ref >90)

## 2012-07-15 MED ORDER — OXYCODONE-ACETAMINOPHEN 5-325 MG PO TABS
1.0000 | ORAL_TABLET | ORAL | Status: DC | PRN
Start: 2012-07-15 — End: 2012-07-16
  Administered 2012-07-15 – 2012-07-16 (×2): 2 via ORAL
  Filled 2012-07-15 (×2): qty 2

## 2012-07-15 MED ORDER — POTASSIUM CHLORIDE CRYS ER 10 MEQ PO TBCR
10.0000 meq | EXTENDED_RELEASE_TABLET | Freq: Two times a day (BID) | ORAL | Status: DC
  Administered 2012-07-15 – 2012-07-16 (×3): 10 meq via ORAL
  Filled 2012-07-15 (×4): qty 1

## 2012-07-15 MED ORDER — KCL IN DEXTROSE-NACL 20-5-0.9 MEQ/L-%-% IV SOLN
INTRAVENOUS | Status: DC
  Filled 2012-07-15 (×3): qty 1000

## 2012-07-15 MED ORDER — HYDROCODONE-ACETAMINOPHEN 5-325 MG PO TABS: 1.0000 | ORAL_TABLET | ORAL | Status: DC | PRN

## 2012-07-15 NOTE — Progress Notes (Signed)
Patient ID: Sarah Nash, female   DOB: [birth year], 31 y.o.   MRN: [id number] 1 Day Post-Op  Subjective: 31 y/o female POD 1 s/p Lap Chole for Cholelithiasis.  Pt recently delivered a baby girl on 06/22/12.  Pt doing okay today.  She c/o of abdominal pain over the umbilical and epigastric incisions, no pain over R sided incisions.  Pt denies any nausea, but c/o a headache which is resolving with pain meds.  Pt is up ambulating OOB and is tolerating liquids well.  Pt wishes to progress diet.  Pt denies any flatus or BM yet.  No chest pain, SOB, or leg tenderness.    Objective: Vital signs in last 24 hours: Temp:  [97.9 F (36.6 C)-99.1 F (37.3 C)] 98.7 F (37.1 C) (10/24 0600) Pulse Rate:  [67-88] 72  (10/24 0600) Resp:  [18-23] 18  (10/24 0600) BP: (132-165)/(69-97) 132/86 mmHg (10/24 0600) SpO2:  [95 %-100 %] 95 % (10/24 0600) Weight:  [400 lb (181.439 kg)] 400 lb (181.439 kg) (10/23 1700) Last BM Date: 07/13/12  Intake/Output from previous day: 10/23 0701 - 10/24 0700 In: 2600 [I.V.:2600] Out: -  Intake/Output this shift:    PE: Card:  RRR, no murmurs Pulm:  CTA, good resp effort Abd: Soft, mildly distended, obese abdomen, moderate tenderness over umbilical and epigastric incisions, incisions all C/D/I.  No drains. Ext:  No erythema, edema, or tenderness  Lab Results:   Basename 07/14/12 0507  WBC 10.4  HGB 10.3*  HCT 34.7*  PLT 394   BMET  Basename 07/14/12 0507  NA 139  K 2.7*  CL 103  CO2 25  GLUCOSE 110*  BUN 4*  CREATININE 0.68  CALCIUM 8.9   PT/INR No results found for this basename: LABPROT:2,INR:2 in the last 72 hours CMP     Component Value Date/Time   NA 139 07/14/2012 0507   K 2.7* 07/14/2012 0507   CL 103 07/14/2012 0507   CO2 25 07/14/2012 0507   GLUCOSE 110* 07/14/2012 0507   BUN 4* 07/14/2012 0507   CREATININE 0.68 07/14/2012 0507   CREATININE 0.48* 04/05/2012 1153   CALCIUM 8.9 07/14/2012 0507   PROT 8.3 07/14/2012 0507   ALBUMIN 3.6  07/14/2012 0507   AST 15 07/14/2012 0507   ALT 13 07/14/2012 0507   ALKPHOS 89 07/14/2012 0507   BILITOT 0.2* 07/14/2012 0507   GFRNONAA >90 07/14/2012 0507   GFRAA >90 07/14/2012 0507   Lipase     Component Value Date/Time   LIPASE 16 07/14/2012 0507       Studies/Results: US Abdomen Complete  07/14/2012  *RADIOLOGY REPORT*  Clinical Data:  Abdominal pain.  COMPLETE ABDOMINAL ULTRASOUND  Comparison:  None.  Findings:  Gallbladder:  Multiple gallstones are present.  No gallbladder wall thickening.  Negative sonographic Murphy's sign.  Common bile duct:  Poorly visualized due to bowel gas artifact distally.  Proximally measures up to 5 mm.  Liver:  Heterogeneous/increased in echogenicity.  This limits focal lesion detection.  IVC:  Appears normal.  Pancreas:  No focal abnormality identified.  Portions of the body, neck, and tail are obscured by bowel gas artifact.  Spleen:  Measures up to 9.8 cm oblique.  No focal abnormality.  Right Kidney:  Measures 13.1 cm.  No hydronephrosis or focal abnormality.  Left Kidney:  Measures 12.7 cm.  No hydronephrosis or focal abnormality.  Abdominal aorta:  Poorly visualized due to bowel gas artifact and patient body habitus.  Measures up to 2.4  cm where seen. The distal aorta and bifurcation are obscured.  IMPRESSION: Cholelithiasis without sonographic evidence for cholecystitis.  Heterogeneous/increased liver echogenicity suggests hepatic steatosis.  Technically challenging examination due to bowel gas artifact and patient body habitus.  Distal CBD and portions of the pancreas and aorta are obscured.   Original Report Authenticated By: Waneta Martins, M.D.     Anti-infectives: Anti-infectives     Start     Dose/Rate Route Frequency Ordered Stop   07/14/12 1230   ceFAZolin (ANCEF) 3 g in dextrose 5 % 50 mL IVPB        3 g 160 mL/hr over 30 Minutes Intravenous  Once 07/14/12 1217 07/14/12 1237           Assessment/Plan 31 y/o female POD 1  after lap chole for cholelithiasis 1.  Cont pain meds, will switch to PO in anticipation of going home if tolerates food well. 2.  Progressed to low fat diet 3.  If pain is better controlled then may be able to go home today, if not tomorrow for discharge. 4.  Ambulate OOB  Hypokalemia 1.   Repeat labs to recheck K+ which was 2.7 at admission 2.  Cont. Supplement, will switch to oral potassium if lab is normal   LOS: 1 day    DORT, Chandan Fly 07/15/2012, 11:17 AM Pager: [phone]

## 2012-07-15 NOTE — Progress Notes (Signed)
Potassium level of 3.0 noted and called to Aris [place], [place]

## 2012-07-15 NOTE — Progress Notes (Addendum)
Wrong chart

## 2012-07-15 NOTE — Discharge Summary (Signed)
  Physician Discharge Summary  Patient ID: Sarah Nash MRN: [id number] DOB/AGE: 32-Feb-1982 32 y.o.  Admit date: 07/14/2012 Discharge date: 07/15/2012  Admitting Diagnosis: Cholelithiasis   Discharge Diagnosis Patient Active Problem List   Diagnosis Date Noted  . Benign essential hypertension antepartum 03/01/2012  . Unspecified high-risk pregnancy 03/01/2012  . Previous cesarean delivery, antepartum condition or complication 03/01/2012  . Obesity in pregnancy, antepartum 03/01/2012    Consultants Dr. Carolynne Edouard  Procedures LAPAROSCOPIC CHOLECYSTECTOMY  Hospital Course:  31 y/o female who presented to [facility] with abdominal pain, N/V 3 weeks out from c-section.  Workup showed Cholelithiasis without sonographic evidence for cholecystitis on ultrasound.  She also presented with hypokalemia.  Patient was admitted and underwent procedure listed above on 07/15/12.  Tolerated procedure well and was transferred to the floor.  Diet was advanced as tolerated.  On POD 2, the patient was voiding well, tolerating diet, ambulating well, pain well controlled, vital signs stable, incisions c/d/i and felt stable for discharge home.  Patient will follow up in our office in 2 weeks and knows to call with questions or concerns.    Medication List     As of 07/15/2012 11:59 AM    TAKE these medications         HYDROcodone-acetaminophen 5-325 MG per tablet   Commonly known as: NORCO/VICODIN   Take 1-2 tablets by mouth every 4 (four) hours as needed (for moderate to severe pain).      ibuprofen 600 MG tablet   Commonly known as: ADVIL,MOTRIN   Take 1 tablet (600 mg total) by mouth every 6 (six) hours.      ondansetron 4 MG tablet   Commonly known as: ZOFRAN   Take 1 tablet (4 mg total) by mouth every 6 (six) hours. As needed for nausea      oxyCODONE-acetaminophen 5-325 MG per tablet   Commonly known as: PERCOCET/ROXICET   Take 1-2 tablets by mouth every 4 (four) hours as needed (moderate - severe  pain).      promethazine 25 MG tablet   Commonly known as: PHENERGAN   Take 1 tablet (25 mg total) by mouth every 6 (six) hours as needed for nausea.             Follow-up Information    Schedule an appointment as soon as possible for a visit with [facility].   Contact information:   [address] [place] [address] [phone]        [facility] Follow up appointment with Dr. Carolynne Edouard on Nov 11th at 11:00am, arrive at 10:45am for appointment.    Signed: Aris [place], [facility] [phone]  07/15/2012, 11:59 AM

## 2012-07-16 ENCOUNTER — Encounter (HOSPITAL_COMMUNITY): Payer: Self-pay | Admitting: General Surgery

## 2012-07-16 LAB — BASIC METABOLIC PANEL
Calcium: 8.9 mg/dL (ref 8.4–10.5)
GFR calc non Af Amer: >90 mL/min (ref >90)
Glucose, Bld: 95 mg/dL (ref 70–99)
Sodium: 138 mEq/L (ref 135–145)

## 2012-07-16 MED ORDER — HYDROCODONE-ACETAMINOPHEN 5-325 MG PO TABS: 1.0000 | ORAL_TABLET | ORAL | Status: DC | PRN

## 2012-07-22 ENCOUNTER — Ambulatory Visit: Payer: Medicaid Other | Admitting: Family Medicine

## 2012-08-02 ENCOUNTER — Encounter (INDEPENDENT_AMBULATORY_CARE_PROVIDER_SITE_OTHER): Payer: Medicaid Other | Admitting: General Surgery

## 2012-08-12 ENCOUNTER — Encounter (INDEPENDENT_AMBULATORY_CARE_PROVIDER_SITE_OTHER): Payer: Self-pay | Admitting: General Surgery

## 2012-08-18 ENCOUNTER — Encounter (INDEPENDENT_AMBULATORY_CARE_PROVIDER_SITE_OTHER): Payer: Self-pay | Admitting: General Surgery

## 2012-11-06 ENCOUNTER — Other Ambulatory Visit: Payer: Self-pay

## 2013-07-16 ENCOUNTER — Emergency Department (HOSPITAL_COMMUNITY)
Admission: EM | Admit: 2013-07-16 | Discharge: 2013-07-16 | Disposition: A | Discharge status: Home / Self Care | Payer: Medicaid Other | Attending: Emergency Medicine | Admitting: Emergency Medicine

## 2013-07-16 ENCOUNTER — Encounter (HOSPITAL_COMMUNITY): Payer: Self-pay | Admitting: Emergency Medicine

## 2013-07-16 DIAGNOSIS — F329 Major depressive disorder, single episode, unspecified: Secondary | ICD-10-CM | POA: Insufficient documentation

## 2013-07-16 DIAGNOSIS — R609 Edema, unspecified: Secondary | ICD-10-CM | POA: Insufficient documentation

## 2013-07-16 DIAGNOSIS — R112 Nausea with vomiting, unspecified: Secondary | ICD-10-CM | POA: Insufficient documentation

## 2013-07-16 DIAGNOSIS — R509 Fever, unspecified: Secondary | ICD-10-CM | POA: Insufficient documentation

## 2013-07-16 DIAGNOSIS — Z87891 Personal history of nicotine dependence: Secondary | ICD-10-CM | POA: Insufficient documentation

## 2013-07-16 DIAGNOSIS — F3289 Other specified depressive episodes: Secondary | ICD-10-CM | POA: Insufficient documentation

## 2013-07-16 DIAGNOSIS — J3489 Other specified disorders of nose and nasal sinuses: Secondary | ICD-10-CM | POA: Insufficient documentation

## 2013-07-16 DIAGNOSIS — L509 Urticaria, unspecified: Secondary | ICD-10-CM

## 2013-07-16 DIAGNOSIS — J020 Streptococcal pharyngitis: Secondary | ICD-10-CM

## 2013-07-16 MED ORDER — DIPHENHYDRAMINE HCL 25 MG PO CAPS
50.0000 mg | ORAL_CAPSULE | Freq: Once | ORAL | Status: AC
  Administered 2013-07-16: 50 mg via ORAL
  Filled 2013-07-16: qty 2

## 2013-07-16 MED ORDER — FAMOTIDINE 20 MG PO TABS
20.0000 mg | ORAL_TABLET | Freq: Once | ORAL | Status: AC
  Administered 2013-07-16: 20 mg via ORAL
  Filled 2013-07-16: qty 1

## 2013-07-16 MED ORDER — FAMOTIDINE 20 MG PO TABS: 20.0000 mg | ORAL_TABLET | Freq: Two times a day (BID) | ORAL | Status: DC

## 2013-07-16 MED ORDER — PREDNISONE 20 MG PO TABS
60.0000 mg | ORAL_TABLET | Freq: Once | ORAL | Status: AC
  Administered 2013-07-16: 60 mg via ORAL
  Filled 2013-07-16: qty 3

## 2013-07-16 MED ORDER — PENICILLIN G BENZATHINE 1200000 UNIT/2ML IM SUSP
1.2000 10*6.[IU] | Freq: Once | INTRAMUSCULAR | Status: AC
  Administered 2013-07-16: 1.2 10*6.[IU] via INTRAMUSCULAR
  Filled 2013-07-16: qty 2

## 2013-07-16 MED ORDER — PREDNISONE 20 MG PO TABS: 60.0000 mg | ORAL_TABLET | Freq: Every day | ORAL | Status: DC

## 2013-07-16 NOTE — ED Provider Notes (Signed)
Medical screening examination/treatment/procedure(s) were performed by non-physician practitioner and as supervising physician I was immediately available for consultation/collaboration.    Gwyneth Sprout, MD 07/16/13 2152

## 2013-07-16 NOTE — ED Notes (Signed)
Pt c/o nasal congestion, sore throat, chills, rash. Pt states she also vomited once yesterday and had diarrhea yesterday. Pt states she has rash all over which started today. States rash is itchy and is on arms, legs, head and chest. Pt states she has taken Theraflu. Pt denies cough. Pt with no acute distress.

## 2013-07-16 NOTE — ED Provider Notes (Signed)
CSN: [id number]     Arrival date & time 07/16/13  1506 History  This chart was scribed for non-physician practitioner working with Gwyneth Sprout, MD by Ashley Jacobs, ED scribe. This patient was seen in room WTR7/WTR7 and the patient's care was started at 4:31 PM.   First MD Initiated Contact with Patient 07/16/13 1612     Chief Complaint  Patient presents with  . Sore Throat  . Nasal Congestion  . Rash   (Consider location/radiation/quality/duration/timing/severity/associated sxs/prior Treatment) HPI HPI Comments: Sarah Nash is a 32 y.o. female who presents to the Emergency Department complaining of sore throat, nasal congestion and rash. Pt reports the associated symptom of nasal congestion, sore throat, painful swallowing, vomiting, nausea, fever, chills and facial swelling. She is also experiencing recurrent hives for the past three months. She experienced her last episode of hives last night and they diminished this morning. However, she states the hives have returned over the past few hours and they are diffused throughout her trunk, neck and arms.She states having Strep years ago. Pt denies cough. She is a former smoker that quit last year and she does not drink alcohol.   Past Medical History  Diagnosis Date  . Depression   . Obesity   . Abnormal Pap smear 2006  . Chlamydia april 2013  . Pregnancy induced hypertension     "they are watching my b/p"   Past Surgical History  Procedure Laterality Date  . Cesarean section  [id number]  . Cesarean section  06/22/2012    Procedure: CESAREAN SECTION;  Surgeon: Reva Bores, MD;  Location: [facility];  Service: Obstetrics;  Laterality: N/A;  . Cholecystectomy  07/14/2012    Procedure: LAPAROSCOPIC CHOLECYSTECTOMY;  Surgeon: Robyne Askew, MD;  Location: [facility];  Service: General;  Laterality: N/A;   Family History  Problem Relation Age of Onset  . Anesthesia problems Neg Hx   . Hypertension Father   . Mental illness Father     History  Substance Use Topics  . Smoking status: Former Smoker    Quit date: 09/27/2011  . Smokeless tobacco: Never Used  . Alcohol Use: No   OB History   Grav Para Term Preterm Abortions TAB SAB Ect Mult Living   3 3 3       3      Review of Systems  Constitutional: Positive for fever and chills.  HENT: Positive for congestion (nasal), facial swelling, sore throat and trouble swallowing.   Respiratory: Negative for choking.   Gastrointestinal: Positive for nausea and vomiting.  Skin: Positive for rash.  All other systems reviewed and are negative.    Allergies  Review of patient's allergies indicates no known allergies.  Home Medications   Current Outpatient Rx  Name  Route  Sig  Dispense  Refill  . pseudoephedrine-acetaminophen (TYLENOL SINUS) 30-500 MG TABS   Oral   Take 1 tablet by mouth every 4 (four) hours as needed.          BP 138/85  Pulse 119  Temp(Src) 99.5 F (37.5 C) (Oral)  Resp 18  SpO2 100% Physical Exam  Nursing note and vitals reviewed. Constitutional: She is oriented to person, place, and time. She appears well-developed and well-nourished. No distress.  HENT:  Head: Normocephalic and atraumatic.  Right Ear: External ear normal.  Left Ear: External ear normal.  Mouth/Throat: No oropharyngeal exudate.  Boggy turbuinates, clear rhinorrhea  Mild erythema to posterior pharynx  Eyes: Conjunctivae are normal. Pupils are  equal, round, and reactive to light. No scleral icterus.  Neck: Normal range of motion. Neck supple.  Cardiovascular: Normal rate, regular rhythm and normal heart sounds.  Exam reveals no gallop and no friction rub.   No murmur heard. Pulmonary/Chest: Effort normal and breath sounds normal. No respiratory distress. She has no wheezes. She has no rales. She exhibits no tenderness.  Abdominal: Soft. Bowel sounds are normal. She exhibits no distension. There is no tenderness.  Musculoskeletal: Normal range of motion. She  exhibits no edema and no tenderness.  Lymphadenopathy:    She has cervical adenopathy.  Neurological: She is alert and oriented to person, place, and time. She exhibits normal muscle tone. Coordination normal.  Skin: Skin is warm and dry. Rash noted.  Hives noted to anterior chest and bilateral forearms.  Psychiatric: She has a normal mood and affect. Her behavior is normal. Judgment and thought content normal.    ED Course  Procedures (including critical care time) DIAGNOSTIC STUDIES: Oxygen Saturation is 100% on room air, normal by my interpretation.    COORDINATION OF CARE: 4:37 PM Discussed course of care with p . Pt understands and agrees.  Labs Review Labs Reviewed  RAPID STREP SCREEN   Imaging Review No results found.  EKG Interpretation   None      Results for orders placed during the hospital encounter of 07/16/13  RAPID STREP SCREEN      Result Value Range   Streptococcus, Group A Screen (Direct) NEGATIVE  NEGATIVE   No results found.  5:22 PM Patient with negative strep, but CENTOR score of 3, will send culture but I have strong suspicion of strep so will treat here with Bicillin LA 1.2 million units.  Patient will be placed on prednisone and pepcid for the hives. MDM  Strep pharyngitis  Patient here with likely strep pharyngitis, pain with swallowing but no evidence to suggest PTA, Ludwig's angina, fever noted here.  Heart rate decreased to 103 after resting.  Believe this to be related to her fever.     I personally performed the services described in this documentation, which was scribed in my presence. The recorded information has been reviewed and is accurate.   Izola Price Marisue Humble, PA-C 07/16/13 1725

## 2013-07-28 ENCOUNTER — Other Ambulatory Visit: Payer: Self-pay

## 2014-05-21 IMAGING — US US OB TRANSVAGINAL
1 series · 12 of 12 positions shown · non-contrast
Comparison: none

[Series 1: us ob transvaginal · 12 of 12 slices shown]
[im 1/12]
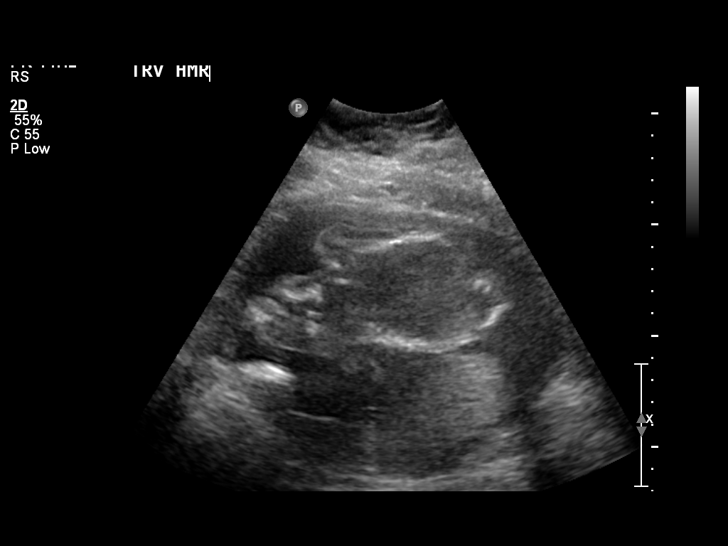
[im 2/12]
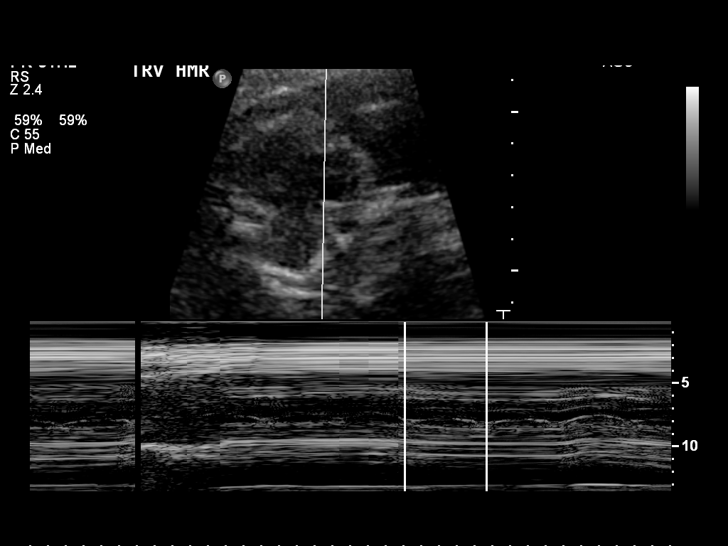
[im 3/12]
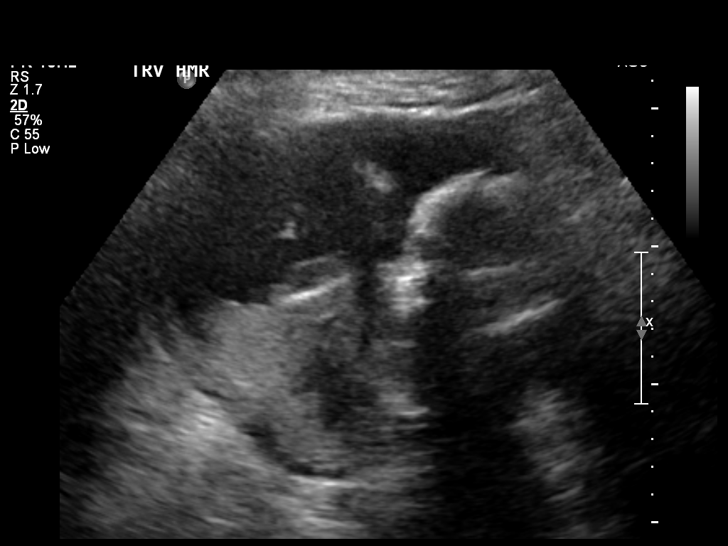
[im 4/12]
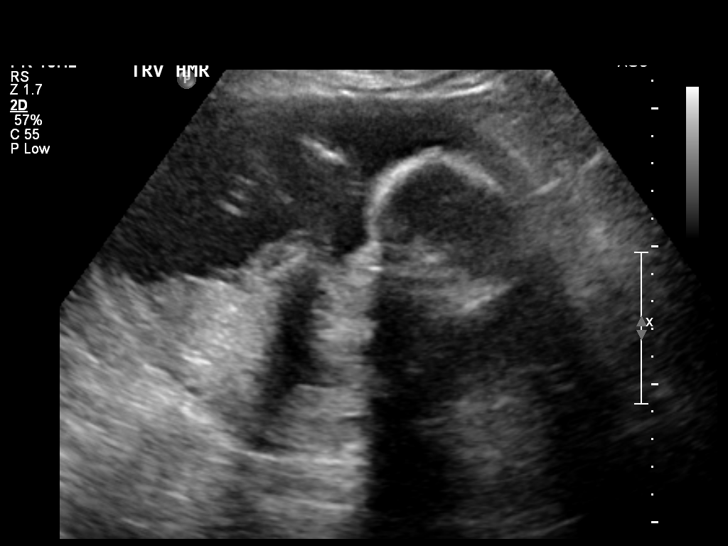
[im 5/12]
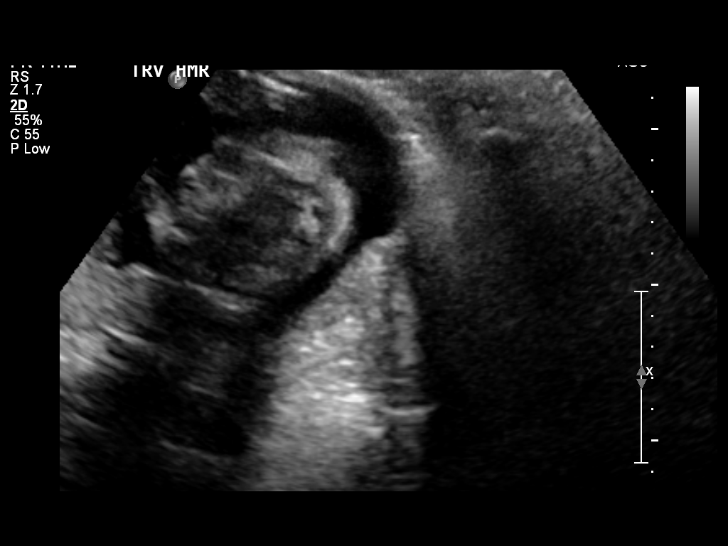
[im 6/12]
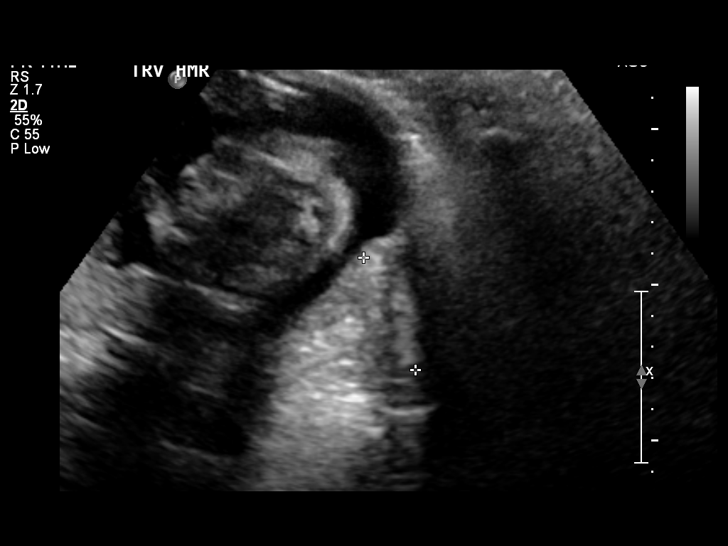
[im 7/12]
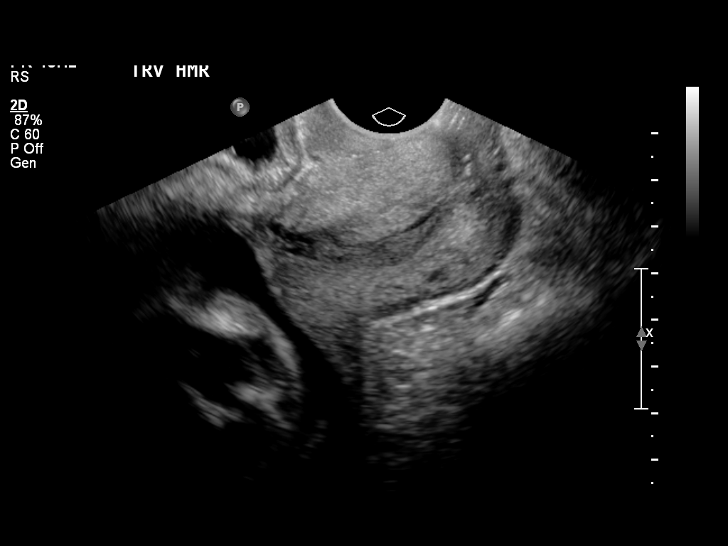
[im 8/12]
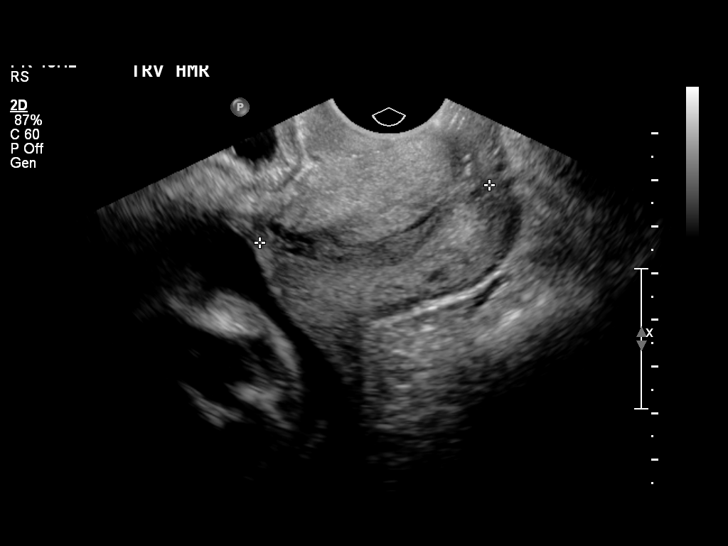
[im 9/12]
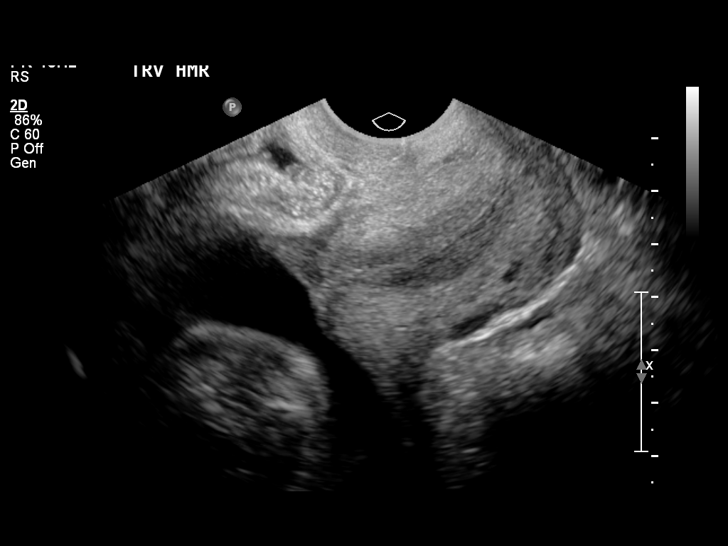
[im 10/12]
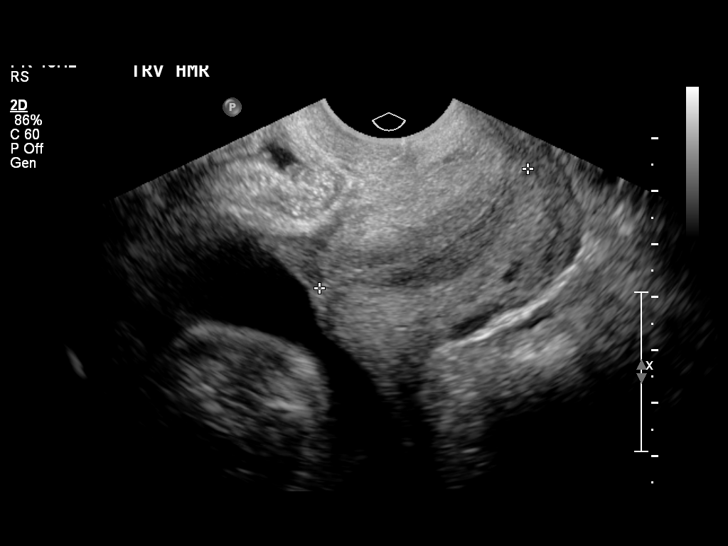
[im 11/12]
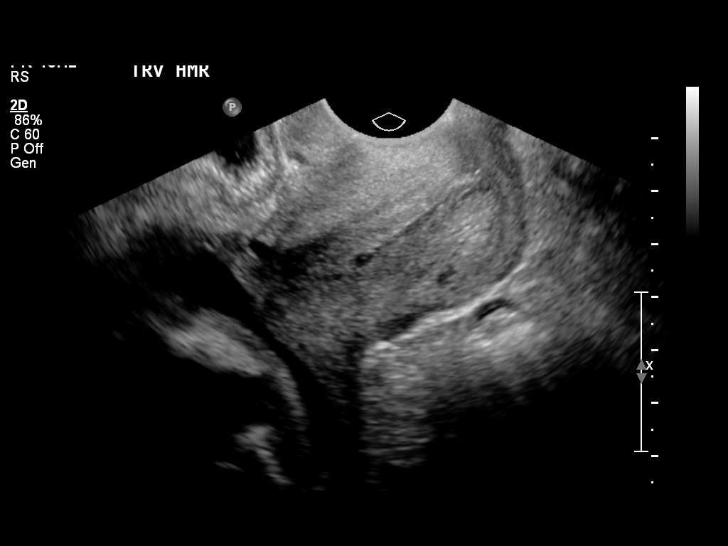
[im 12/12]
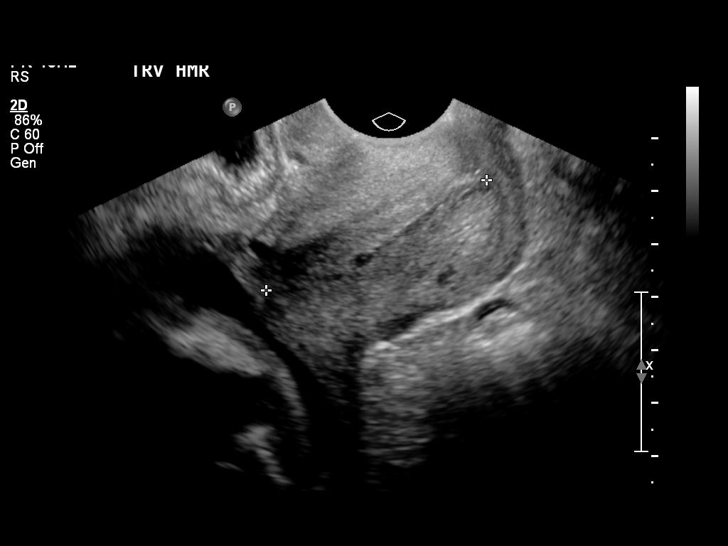

[12 of 12 positions shown; findings below may reference images not displayed]

OBSTETRICS REPORT
                      (Signed Final 02/19/2012 [DATE])

Procedures

 US OB TRANSVAGINAL                                    76817.0
Indications

 Pain - Abdominal/Pelvic
 Assess cervical length
 Morbid obesity - 442 lbs
Fetal Evaluation

 Fetal Heart Rate:  130                         bpm
 Cardiac Activity:  Observed
 Presentation:      Transverse, head to
                    maternal right
Cervix Uterus Adnexa

 Cervical Length:   5.08      cm

 Cervix:       Normal appearance by transvaginal scan
Comments

 This study was performed as an on call procedure and was
 initially reviewed by Dr. Togola Lele on 02/18/2012.
Impression

 Normal cervical length and appearance, measured
 transvaginally.

 questions or concerns.

## 2014-06-03 IMAGING — US US OB DETAIL+14 WK
1 series · 12 of 28 positions shown · non-contrast
Comparison: none

[Series 1: us ob detail +14 wk · 12 of 81 slices shown]
[im 3/81]
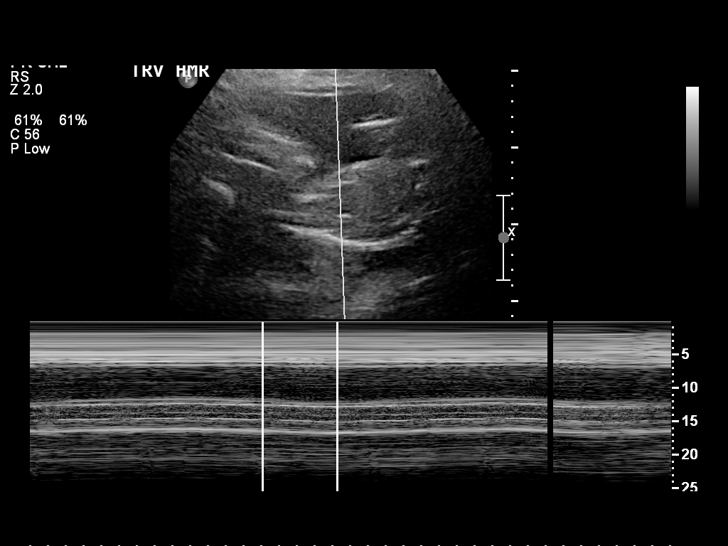
[im 9/81]
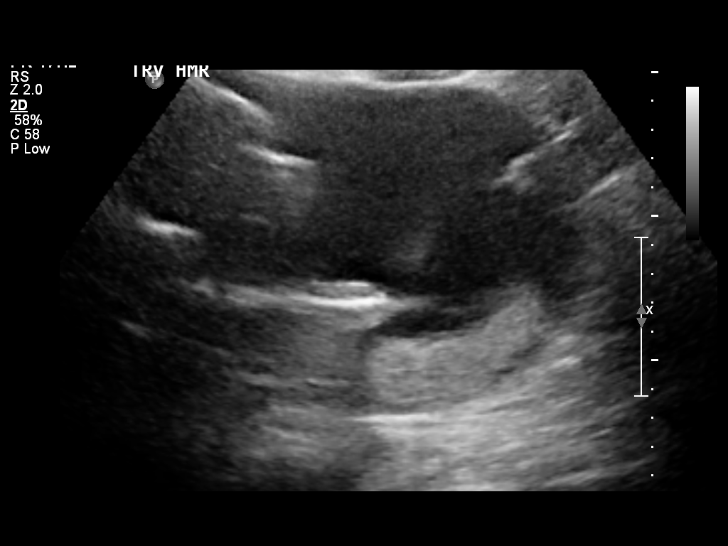
[im 15/81]
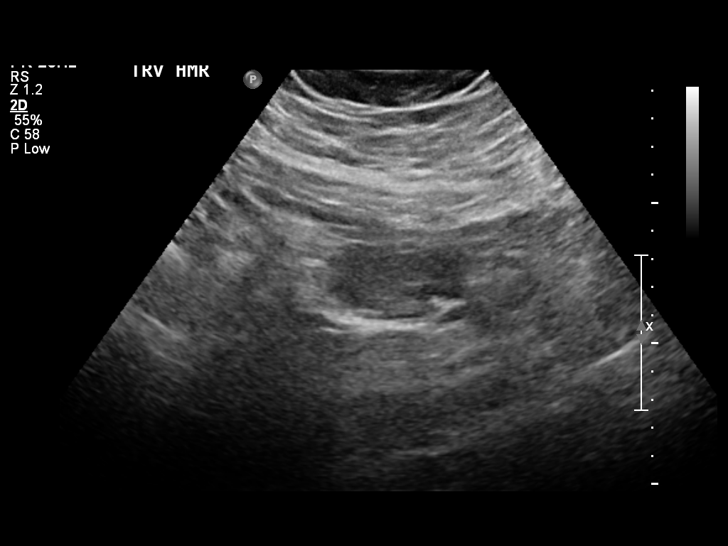
[im 24/81]
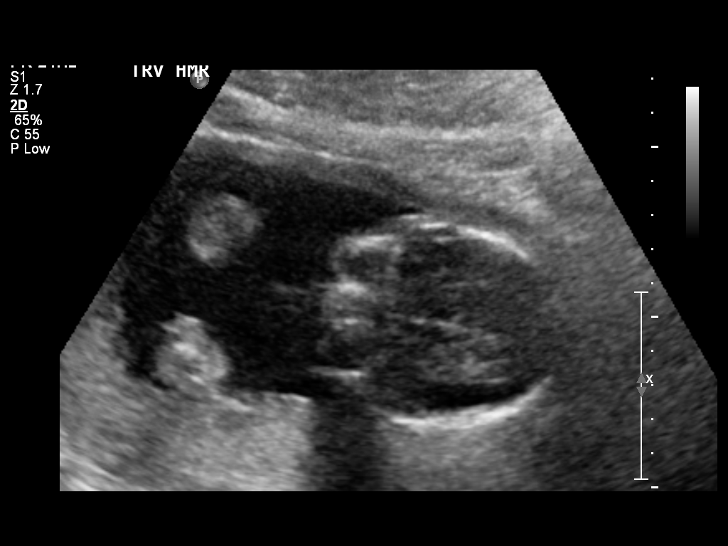
[im 30/81]
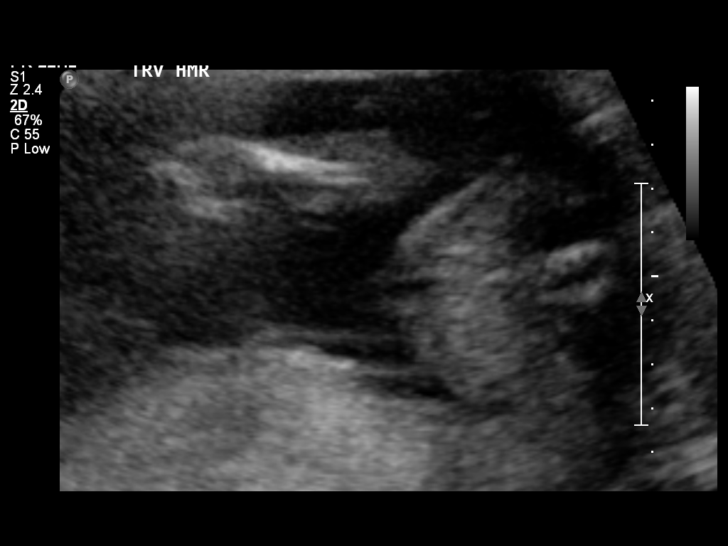
[im 36/81]
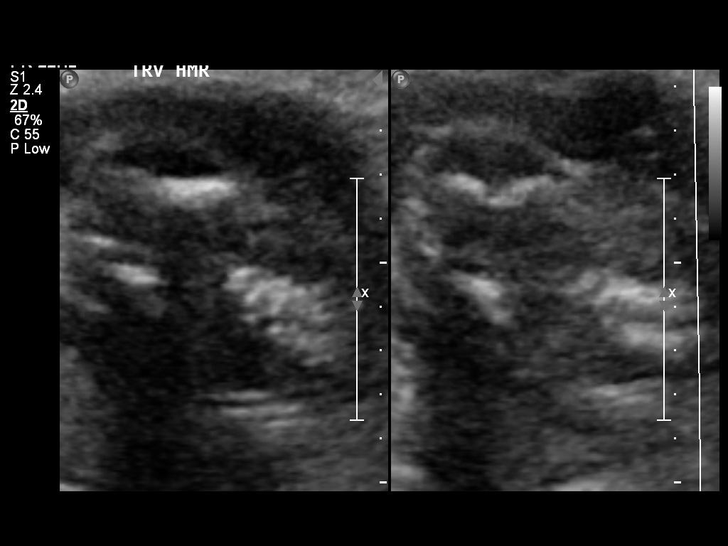
[im 45/81]
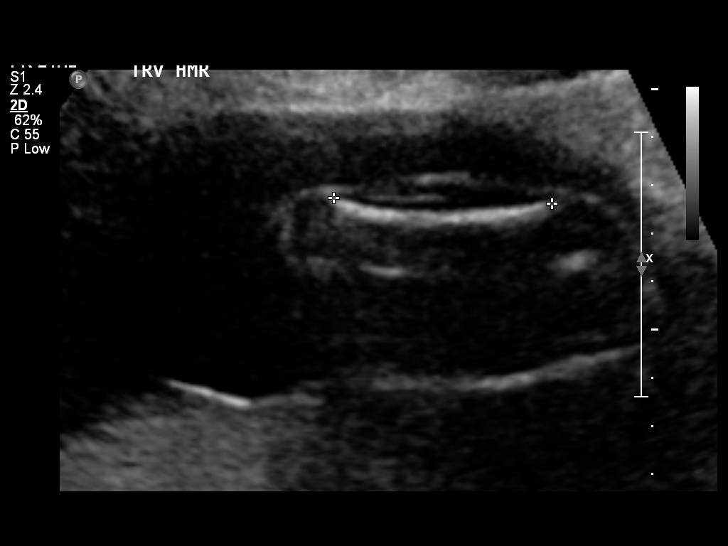
[im 51/81]
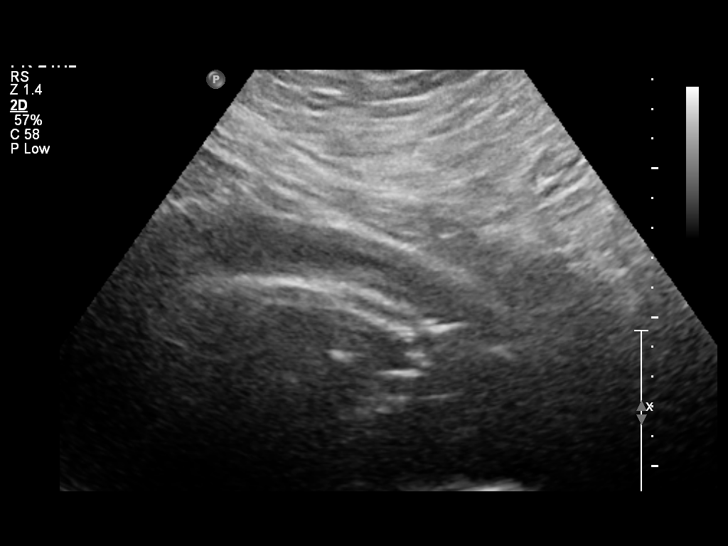
[im 57/81]
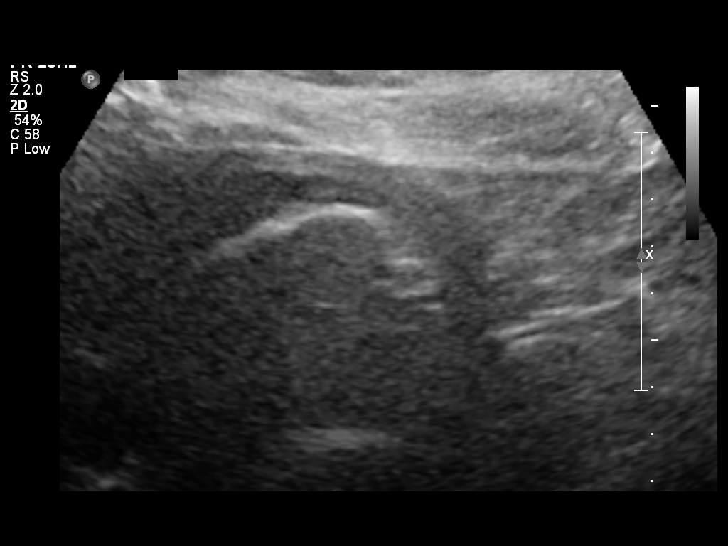
[im 66/81]
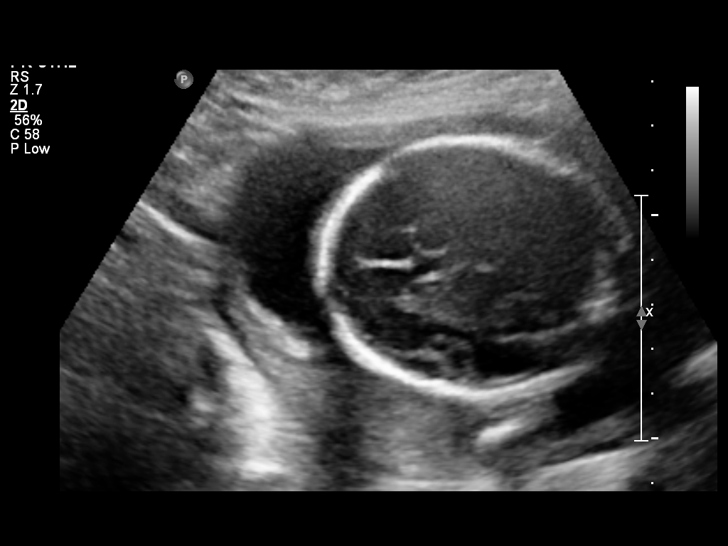
[im 72/81]
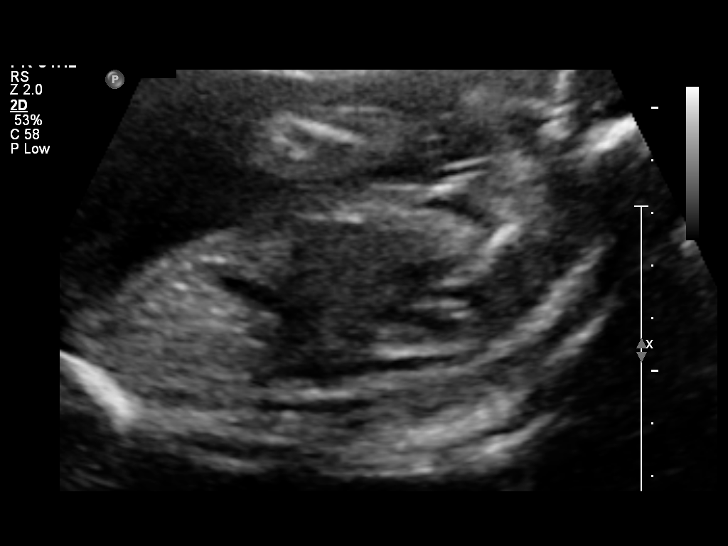
[im 78/81]
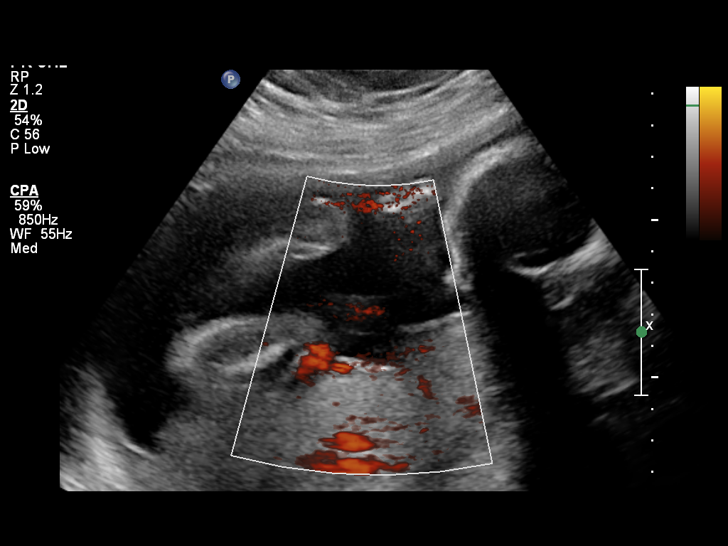

[12 of 28 positions shown; findings below may reference images not displayed]

OBSTETRICS REPORT
                      (Signed Final 03/02/2012 [DATE])

 Order#:         [id number]
Procedures

 US OB DETAIL + 14 WK                                  76811.0
Indications

 Detailed fetal anatomic survey
 Morbid Obesity (442 lbs).
 Poor obstetric history: Previous gestational HTN x 2
 Previous cesarean section x 2
 Pelvic / Abdominal pain
 History of Depression
Fetal Evaluation

 Fetal Heart Rate:  143                         bpm
 Cardiac Activity:  Observed
 Fetal Lie:         Variable
 Presentation:      Cephalic
 Placenta:          Posterior, above cervical
                    os
 P. Cord            Visualized
 Insertion:

 Amniotic Fluid
 AFI FV:      Subjectively within normal limits
                                             Larg Pckt:     6.5  cm
Biometry

 BPD:     56.4  mm    G. Age:   23w 2d                CI:        73.88   70 - 86
                                                      FL/HC:      21.4   19.2 -

 HC:     208.4  mm    G. Age:   23w 0d       33  %    HC/AC:      1.07   1.05 -

 AC:     194.5  mm    G. Age:   24w 1d       77  %    FL/BPD:     78.9   71 - 87
 FL:      44.5  mm    G. Age:   24w 5d       86  %    FL/AC:      22.9   20 - 24
 HUM:     41.7  mm    G. Age:   25w 1d       92  %

 Est. FW:     668  gm      1 lb 8 oz     72  %
Gestational Age

 Clinical EDD:  23w 0d                                        EDD:   06/29/12
 U/S Today:     23w 5d                                        EDD:   06/24/12
 Best:          23w 0d    Det. By:   Clinical EDD             EDD:   06/29/12
Anatomy

 Cranium:           Appears normal      Aortic Arch:       Appears normal
 Fetal Cavum:       Appears normal      Ductal Arch:       Not well
                                                           visualized
 Ventricles:        Appears normal      Diaphragm:         Appears normal
 Choroid Plexus:    Appears normal      Stomach:           Appears
                                                           normal, left
                                                           sided
 Cerebellum:        Appears normal      Abdomen:           Appears normal
 Posterior Fossa:   Appears normal      Abdominal Wall:    Appears nml
                                                           (cord insert,
                                                           abd wall)
 Nuchal Fold:       Not applicable      Cord Vessels:      Appears normal
                    (>20 wks GA)                           (3 vessel cord)
 Face:              Lips appear         Kidneys:           Appear normal
                    normal
 Heart:             Not well            Bladder:           Appears normal
                    visualized
 RVOT:              Not well            Spine:             Not well
                    visualized                             visualized
 LVOT:              Not well            Limbs:             Four extremities
                    visualized                             seen

 Other:     Technically difficult due to  maternal habitus.
Targeted Anatomy

 Fetal Central Nervous System
 Lat. Ventricles:
Cervix Uterus Adnexa

 Cervical Length:   3.7       cm

 Cervix:       Normal appearance by transabdominal scan.

 Left Ovary:   Within normal limits.
 Right Ovary:  Within normal limits.
 Adnexa:     No abnormality visualized.
Impression

 Single live IUP in variable presentation.  Concordant
 measurements/assigned GA by LMP.
 No anomaly seen in visualized structures as listed above.
 Suboptimal visualization due to maternal body habitus and
 fetal position.

 questions or concerns.

## 2014-07-24 ENCOUNTER — Encounter (HOSPITAL_COMMUNITY): Payer: Self-pay | Admitting: Emergency Medicine

## 2014-10-31 ENCOUNTER — Emergency Department (HOSPITAL_COMMUNITY)
Admission: EM | Admit: 2014-10-31 | Discharge: 2014-10-31 | Disposition: A | Discharge status: Home / Self Care | Payer: Medicaid Other | Attending: Emergency Medicine | Admitting: Emergency Medicine

## 2014-10-31 ENCOUNTER — Encounter (HOSPITAL_COMMUNITY): Payer: Self-pay | Admitting: Emergency Medicine

## 2014-10-31 DIAGNOSIS — X58XXXA Exposure to other specified factors, initial encounter: Secondary | ICD-10-CM | POA: Diagnosis not present

## 2014-10-31 DIAGNOSIS — Z8619 Personal history of other infectious and parasitic diseases: Secondary | ICD-10-CM | POA: Insufficient documentation

## 2014-10-31 DIAGNOSIS — M25461 Effusion, right knee: Secondary | ICD-10-CM

## 2014-10-31 DIAGNOSIS — Z87891 Personal history of nicotine dependence: Secondary | ICD-10-CM | POA: Insufficient documentation

## 2014-10-31 DIAGNOSIS — M25462 Effusion, left knee: Secondary | ICD-10-CM | POA: Insufficient documentation

## 2014-10-31 DIAGNOSIS — Y9289 Other specified places as the place of occurrence of the external cause: Secondary | ICD-10-CM | POA: Insufficient documentation

## 2014-10-31 DIAGNOSIS — S8992XA Unspecified injury of left lower leg, initial encounter: Secondary | ICD-10-CM | POA: Insufficient documentation

## 2014-10-31 DIAGNOSIS — E669 Obesity, unspecified: Secondary | ICD-10-CM | POA: Insufficient documentation

## 2014-10-31 DIAGNOSIS — Z8659 Personal history of other mental and behavioral disorders: Secondary | ICD-10-CM | POA: Diagnosis not present

## 2014-10-31 DIAGNOSIS — Y9389 Activity, other specified: Secondary | ICD-10-CM | POA: Insufficient documentation

## 2014-10-31 DIAGNOSIS — Y998 Other external cause status: Secondary | ICD-10-CM | POA: Insufficient documentation

## 2014-10-31 DIAGNOSIS — R030 Elevated blood-pressure reading, without diagnosis of hypertension: Secondary | ICD-10-CM

## 2014-10-31 DIAGNOSIS — Z7952 Long term (current) use of systemic steroids: Secondary | ICD-10-CM | POA: Diagnosis not present

## 2014-10-31 DIAGNOSIS — M25561 Pain in right knee: Secondary | ICD-10-CM

## 2014-10-31 MED ORDER — HYDROCODONE-ACETAMINOPHEN 5-325 MG PO TABS: 1.0000 | ORAL_TABLET | ORAL | Status: DC | PRN

## 2014-10-31 MED ORDER — IBUPROFEN 800 MG PO TABS: 800.0000 mg | ORAL_TABLET | Freq: Three times a day (TID) | ORAL | Status: DC

## 2014-10-31 NOTE — ED Notes (Signed)
ORTHO WITH CRUTCHES AND SLEEVE APPLICATION

## 2014-10-31 NOTE — ED Notes (Signed)
Per pt, states injured left knee about three weeks ago-increased pain and swelling

## 2014-10-31 NOTE — ED Provider Notes (Signed)
CSN: [id number]     Arrival date & time 10/31/14  [id number] History   First MD Initiated Contact with Patient 10/31/14 [phone]     Chief Complaint  Patient presents with  . Knee Pain     (Consider location/radiation/quality/duration/timing/severity/associated sxs/prior Treatment) HPI Comments: The patient is a [age] year old female with past mental history of obesity, presents emergency room chief complaint of persistent left knee pain and swelling for 3 weeks. Patient reports injury occurring 3 weeks ago during intercourse. She denies immediate pain but reports discomfort after, unsure positioning of joint that caused her discomfort. Denies fall. She reports associated swelling since. Patient is able to ambulate without assistance. She reports bending knee and completely  traightening the knee exacerbates pain. No weakness, numbness in knee or lower extremity. Patient has not been evaluated by an orthopedist in the past.  Currently in cosmetology school, stands all day. Reports taking ibuprofen and naproxen without relief. Last oral which appeared 1.5 weeks ago, is not concerned about a possible pregnancy at this time.  Patient is a 34 y.o. female presenting with knee pain. The history is provided by the patient. No language interpreter was used.  Knee Pain Associated symptoms: no fever     Past Medical History  Diagnosis Date  . Depression   . Obesity   . Abnormal Pap smear 2006  . Chlamydia april 2013  . Pregnancy induced hypertension     "they are watching my b/p"   Past Surgical History  Procedure Laterality Date  . Cesarean section  [id number],2009  . Cesarean section  06/22/2012    Procedure: CESAREAN SECTION;  Surgeon: Reva Boresanya S Pratt, MD;  Location: [facility];  Service: Obstetrics;  Laterality: N/A;  . Cholecystectomy  07/14/2012    Procedure: LAPAROSCOPIC CHOLECYSTECTOMY;  Surgeon: Robyne AskewPaul S Toth III, MD;  Location: [facility];  Service: General;  Laterality: N/A;   Family History  Problem Relation Age  of Onset  . Anesthesia problems Neg Hx   . Hypertension Father   . Mental illness Father    History  Substance Use Topics  . Smoking status: Former Smoker    Quit date: 09/27/2011  . Smokeless tobacco: Never Used  . Alcohol Use: No   OB History    Gravida Para Term Preterm AB TAB SAB Ectopic Multiple Living   3 3 3       3      Review of Systems  Constitutional: Negative for fever.  Musculoskeletal: Positive for joint swelling and arthralgias.  Neurological: Negative for weakness and numbness.      Allergies  Review of patient's allergies indicates no known allergies.  Home Medications   Prior to Admission medications   Medication Sig Start Date End Date Taking? Authorizing Provider  famotidine (PEPCID) 20 MG tablet Take 1 tablet (20 mg total) by mouth 2 (two) times daily. 07/16/13   Izola PriceFrances C. Sanford, PA-C  predniSONE (DELTASONE) 20 MG tablet Take 3 tablets (60 mg total) by mouth daily. 07/16/13   Izola PriceFrances C. Sanford, PA-C  pseudoephedrine-acetaminophen (TYLENOL SINUS) 30-500 MG TABS Take 1 tablet by mouth every 4 (four) hours as needed.    Historical Provider, MD   LMP 10/18/2014 Physical Exam  Constitutional: She is oriented to person, place, and time. She appears well-developed and well-nourished. No distress.  Obese female.  HENT:  Head: Normocephalic and atraumatic.  Neck: Neck supple.  Pulmonary/Chest: Effort normal. No respiratory distress.  Musculoskeletal:       Left knee: She exhibits decreased range  of motion. She exhibits no LCL laxity, normal patellar mobility and no MCL laxity. No medial joint line and no lateral joint line tenderness noted.  Decreased range of motion secondary to body habitus. Discomfort with active flexion.  Unable to reproduce discomfort during exam.  Neurological: She is alert and oriented to person, place, and time.  Skin: Skin is warm and dry. She is not diaphoretic.  Psychiatric: She has a normal mood and affect. Her behavior is  normal.  Nursing note and vitals reviewed.   ED Course  Procedures (including critical care time) Labs Review Labs Reviewed - No data to display  Imaging Review No results found.   EKG Interpretation None      MDM   Final diagnoses:  Right knee pain  Knee swelling, right  Elevated blood pressure reading   Patient with me injury 3 weeks ago, with associated knee swelling. Plan to apply knee sleeve, crutches,RICE, orthopedic follow-up.  Pt LNMP 1.5 weeks ago, declined urine pregnancy. Discussed treatment plan with the patient. Return precautions given. Reports understanding and no other concerns at this time.  Patient is stable for discharge at this time. Pt blood pressure elevated on intake and DC. Encouraged PCP follow up for monitoring. Given obesity, HTN will need PCP follow up.  Meds given in ED:  Medications - No data to display  New Prescriptions   HYDROCODONE-ACETAMINOPHEN (NORCO/VICODIN) 5-325 MG PER TABLET    Take 1 tablet by mouth every 4 (four) hours as needed for moderate pain or severe pain.   IBUPROFEN (ADVIL,MOTRIN) 800 MG TABLET    Take 1 tablet (800 mg total) by mouth 3 (three) times daily.    Mellody Drown, PA-C 10/31/14 1057  Arby Barrette, MD 11/01/14 [phone]

## 2014-10-31 NOTE — Discharge Instructions (Signed)
Call for a follow up appointment with an orthopedic specialist for further evaluation of your knee pain and swelling. Elevate your leg when you're not standing or walking above your heart to reduce swelling. Use crutches to help relieve pressure with ambulation. Ice your knee 3-4 times a day, with elevation. Return if Symptoms worsen.   Take medication as prescribed.

## 2015-02-04 ENCOUNTER — Emergency Department (HOSPITAL_COMMUNITY)
Admission: EM | Admit: 2015-02-04 | Discharge: 2015-02-04 | Disposition: A | Discharge status: Home / Self Care | Payer: Medicaid Other | Attending: Emergency Medicine | Admitting: Emergency Medicine

## 2015-02-04 ENCOUNTER — Encounter (HOSPITAL_COMMUNITY): Payer: Self-pay | Admitting: Emergency Medicine

## 2015-02-04 DIAGNOSIS — M545 Low back pain: Secondary | ICD-10-CM | POA: Diagnosis not present

## 2015-02-04 DIAGNOSIS — Z791 Long term (current) use of non-steroidal anti-inflammatories (NSAID): Secondary | ICD-10-CM | POA: Insufficient documentation

## 2015-02-04 DIAGNOSIS — A5901 Trichomonal vulvovaginitis: Secondary | ICD-10-CM | POA: Insufficient documentation

## 2015-02-04 DIAGNOSIS — Z3202 Encounter for pregnancy test, result negative: Secondary | ICD-10-CM | POA: Diagnosis not present

## 2015-02-04 DIAGNOSIS — Z8659 Personal history of other mental and behavioral disorders: Secondary | ICD-10-CM | POA: Diagnosis not present

## 2015-02-04 DIAGNOSIS — Z87891 Personal history of nicotine dependence: Secondary | ICD-10-CM | POA: Insufficient documentation

## 2015-02-04 DIAGNOSIS — R102 Pelvic and perineal pain: Secondary | ICD-10-CM | POA: Diagnosis present

## 2015-02-04 LAB — URINALYSIS, ROUTINE W REFLEX MICROSCOPIC
Bilirubin Urine: NEGATIVE
Glucose, UA: NEGATIVE mg/dL
Hgb urine dipstick: NEGATIVE
Ketones, ur: NEGATIVE mg/dL
Nitrite: NEGATIVE
PROTEIN: NEGATIVE mg/dL
Specific Gravity, Urine: 1.017 (ref 1.005–1.030)
Urobilinogen, UA: 0.2 mg/dL (ref 0.0–1.0)
pH: 7.5 (ref 5.0–8.0)

## 2015-02-04 LAB — PREGNANCY, URINE: PREG TEST UR: NEGATIVE

## 2015-02-04 LAB — WET PREP, GENITAL: Yeast Wet Prep HPF POC: NONE SEEN

## 2015-02-04 LAB — URINE MICROSCOPIC-ADD ON

## 2015-02-04 MED ORDER — METRONIDAZOLE 500 MG PO TABS
2000.0000 mg | ORAL_TABLET | Freq: Once | ORAL | Status: AC
  Administered 2015-02-04: 2000 mg via ORAL
  Filled 2015-02-04: qty 4

## 2015-02-04 MED ORDER — LIDOCAINE HCL (PF) 1 % IJ SOLN
INTRAMUSCULAR | Status: AC
  Administered 2015-02-04: 2 mL
  Filled 2015-02-04: qty 5

## 2015-02-04 MED ORDER — CEFTRIAXONE SODIUM 250 MG IJ SOLR
250.0000 mg | Freq: Once | INTRAMUSCULAR | Status: AC
  Administered 2015-02-04: 250 mg via INTRAMUSCULAR
  Filled 2015-02-04: qty 250

## 2015-02-04 MED ORDER — AZITHROMYCIN 250 MG PO TABS
1000.0000 mg | ORAL_TABLET | Freq: Once | ORAL | Status: AC
  Administered 2015-02-04: 1000 mg via ORAL
  Filled 2015-02-04: qty 4

## 2015-02-04 NOTE — ED Notes (Signed)
Patient denies pain and is resting comfortably.  Just uncomfortable in vaginal area

## 2015-02-04 NOTE — ED Notes (Signed)
Pt states she has been using a different brand of bar soap

## 2015-02-04 NOTE — ED Provider Notes (Signed)
CSN: [id number]     Arrival date & time 02/04/15  1219 History   First MD Initiated Contact with Patient 02/04/15 1231     Chief Complaint  Patient presents with  . Back Pain  . Vaginal Pain     (Consider location/radiation/quality/duration/timing/severity/associated sxs/prior Treatment) HPI   34 year old obese female with prior history of STDs presents for evaluation of vaginal discomfort. Patient reports for the past 3 days she has noticed vaginal discomfort and left lower back pain. She described back pain as a random sharp shooting pain lasting for seconds. Reports having pain while urinating but decreased frequency or urgency. She has noticed some mild vaginal discharge. She did change her body soap a week ago. She denies fever, chills, nausea vomiting diarrhea, abdominal pain, vaginal bleeding, hematuria, hematochezia or melena. Remote history of STD. Reports pain with sexual activities 2 weeks ago. Her last menstrual period was April 20.  Past Medical History  Diagnosis Date  . Depression   . Obesity   . Abnormal Pap smear 2006  . Chlamydia april 2013  . Pregnancy induced hypertension     "they are watching my b/p"   Past Surgical History  Procedure Laterality Date  . Cesarean section  [id number],2009  . Cesarean section  06/22/2012    Procedure: CESAREAN SECTION;  Surgeon: Reva Boresanya S Pratt, MD;  Location: [facility];  Service: Obstetrics;  Laterality: N/A;  . Cholecystectomy  07/14/2012    Procedure: LAPAROSCOPIC CHOLECYSTECTOMY;  Surgeon: Robyne AskewPaul S Toth III, MD;  Location: [facility];  Service: General;  Laterality: N/A;   Family History  Problem Relation Age of Onset  . Anesthesia problems Neg Hx   . Hypertension Father   . Mental illness Father    History  Substance Use Topics  . Smoking status: Former Smoker    Quit date: 09/27/2011  . Smokeless tobacco: Never Used  . Alcohol Use: No   OB History    Gravida Para Term Preterm AB TAB SAB Ectopic Multiple Living   3 3 3       3       Review of Systems  All other systems reviewed and are negative.     Allergies  Bee venom  Home Medications   Prior to Admission medications   Medication Sig Start Date End Date Taking? Authorizing Provider  HYDROcodone-acetaminophen (NORCO/VICODIN) 5-325 MG per tablet Take 1 tablet by mouth every 4 (four) hours as needed for moderate pain or severe pain. 10/31/14   Mellody DrownLauren Parker, PA-C  ibuprofen (ADVIL,MOTRIN) 800 MG tablet Take 1 tablet (800 mg total) by mouth 3 (three) times daily. 10/31/14   Lauren Parker, PA-C   BP 182/92 mmHg  Pulse 99  Temp(Src) 98 F (36.7 C) (Oral)  Resp 20  SpO2 99%  LMP 01/10/2015 (Approximate) Physical Exam  Constitutional: She appears well-developed and well-nourished. No distress.  Morbidly obese female appears to be in no acute distress.  HENT:  Head: Atraumatic.  Eyes: Conjunctivae are normal.  Neck: Neck supple.  Cardiovascular: Normal rate and regular rhythm.   Pulmonary/Chest: Effort normal and breath sounds normal.  Abdominal: Soft. She exhibits no distension. There is tenderness (Left lower pelvic pain without guarding or rebound tenderness.).  Genitourinary:  Chaperone present during exam. No inguinal lymphadenopathy or inguinal hernia noted. Mild vaginal discharge noted in vaginal vault. Cervical os closed. No foreign object noted. On bimanual examination patient has left adnexal tenderness but no cervical motion tenderness. Exam is limited due to large body habitus.  Neurological:  She is alert.  Skin: No rash noted.  Psychiatric: She has a normal mood and affect.  Nursing note and vitals reviewed.   ED Course  Procedures (including critical care time)  Patient presents with vaginal discomfort. Workup initiated.  3:12 PM Patient had left adnexal tenderness and vaginal discharge. She has a nonsurgical abdomen. Her pregnancy test is negative. Evidence of trichomonas on wet prep. UA shows no evidence of inner tract infection. Will  provide Rocephin/Zithromax for possible STD. Culture sent.  Labs Review Labs Reviewed  WET PREP, GENITAL - Abnormal; Notable for the following:    Trich, Wet Prep FEW (*)    Clue Cells Wet Prep HPF POC FEW (*)    WBC, Wet Prep HPF POC FEW (*)    All other components within normal limits  URINALYSIS, ROUTINE W REFLEX MICROSCOPIC - Abnormal; Notable for the following:    APPearance CLOUDY (*)    Leukocytes, UA SMALL (*)    All other components within normal limits  URINE MICROSCOPIC-ADD ON - Abnormal; Notable for the following:    Squamous Epithelial / LPF FEW (*)    Bacteria, UA FEW (*)    All other components within normal limits  PREGNANCY, URINE  RPR  HIV ANTIBODY (ROUTINE TESTING)  GC/CHLAMYDIA PROBE AMP ([place])    Imaging Review No results found.   EKG Interpretation None      MDM   Final diagnoses:  Trichomonal vaginitis    BP 159/102 mmHg  Pulse 80  Temp(Src) 98.6 F (37 C) (Oral)  Resp 18  SpO2 100%  LMP 01/10/2015 (Approximate)     Sarah HelperBowie Caitlyne Ingham, PA-C 02/04/15 1513  Purvis SheffieldForrest Harrison, MD 02/05/15 1550

## 2015-02-04 NOTE — ED Notes (Signed)
Pt c/o burning, uncomfortable feeling when urinating.   Random back pain, sharp - middle to side of back

## 2015-02-04 NOTE — Discharge Instructions (Signed)
You have been evaluated for your symptoms.  You have evidence of trichomoniasis.  You are also being treated for possible STD.  Avoid sexual activities until your symptoms resolved.  Return if your condition worsen or if you have other concerns.   Trichomoniasis Trichomoniasis is an infection caused by an organism called Trichomonas. The infection can affect both women and men. In women, the outer female genitalia and the vagina are affected. In men, the penis is mainly affected, but the prostate and other reproductive organs can also be involved. Trichomoniasis is a sexually transmitted infection (STI) and is most often passed to another person through sexual contact.  RISK FACTORS  Having unprotected sexual intercourse.  Having sexual intercourse with an infected partner. SIGNS AND SYMPTOMS  Symptoms of trichomoniasis in women include:  Abnormal gray-green frothy vaginal discharge.  Itching and irritation of the vagina.  Itching and irritation of the area outside the vagina. Symptoms of trichomoniasis in men include:   Penile discharge with or without pain.  Pain during urination. This results from inflammation of the urethra. DIAGNOSIS  Trichomoniasis may be found during a Pap test or physical exam. Your health care provider may use one of the following methods to help diagnose this infection:  Examining vaginal discharge under a microscope. For men, urethral discharge would be examined.  Testing the pH of the vagina with a test tape.  Using a vaginal swab test that checks for the Trichomonas organism. A test is available that provides results within a few minutes.  Doing a culture test for the organism. This is not usually needed. TREATMENT   You may be given medicine to fight the infection. Women should inform their health care provider if they could be or are pregnant. Some medicines used to treat the infection should not be taken during pregnancy.  Your health care provider  may recommend over-the-counter medicines or creams to decrease itching or irritation.  Your sexual partner will need to be treated if infected. HOME CARE INSTRUCTIONS   Take medicines only as directed by your health care provider.  Take over-the-counter medicine for itching or irritation as directed by your health care provider.  Do not have sexual intercourse while you have the infection.  Women should not douche or wear tampons while they have the infection.  Discuss your infection with your partner. Your partner may have gotten the infection from you, or you may have gotten it from your partner.  Have your sex partner get examined and treated if necessary.  Practice safe, informed, and protected sex.  See your health care provider for other STI testing. SEEK MEDICAL CARE IF:   You still have symptoms after you finish your medicine.  You develop abdominal pain.  You have pain when you urinate.  You have bleeding after sexual intercourse.  You develop a rash.  Your medicine makes you sick or makes you throw up (vomit). MAKE SURE YOU:  Understand these instructions.  Will watch your condition.  Will get help right away if you are not doing well or get worse. Document Released: 03/04/2001 Document Revised: 01/23/2014 Document Reviewed: 06/20/2013 [facility] Patient Information 2015 [place] Center[place], [place]. This information is not intended to replace advice given to you by your health care provider. Make sure you discuss any questions you have with your health care provider.  Sexually Transmitted Disease A sexually transmitted disease (STD) is a disease or infection often passed to another person during sex. However, STDs can be passed through nonsexual ways. An  STD can be passed through:  Spit (saliva).  Semen.  Blood.  Mucus from the vagina.  Pee (urine). HOW CAN I LESSEN MY CHANCES OF GETTING AN STD?  Use:  Latex condoms.  Water-soluble lubricants with condoms. Do  not use petroleum jelly or oils.  Dental dams. These are small pieces of latex that are used as a barrier during oral sex.  Avoid having more than one sex partner.  Do not have sex with someone who has other sex partners.  Do not have sex with anyone you do not know or who is at high risk for an STD.  Avoid risky sex that can break your skin.  Do not have sex if you have open sores on your mouth or skin.  Avoid drinking too much alcohol or taking illegal drugs. Alcohol and drugs can affect your good judgment.  Avoid oral and anal sex acts.  Get shots (vaccines) for HPV and hepatitis.  If you are at risk of being infected with HIV, it is advised that you take a certain medicine daily to prevent HIV infection. This is called pre-exposure prophylaxis (PrEP). You may be at risk if:  You are a man who has sex with other men (MSM).  You are attracted to the opposite sex (heterosexual) and are having sex with more than one partner.  You take drugs with a needle.  You have sex with someone who has HIV.  Talk with your doctor about if you are at high risk of being infected with HIV. If you begin to take PrEP, get tested for HIV first. Get tested every 3 months for as long as you are taking PrEP. WHAT SHOULD I DO IF I THINK I HAVE AN STD?  See your doctor.  Tell your sex partner(s) that you have an STD. They should be tested and treated.  Do not have sex until your doctor says it is okay. WHEN SHOULD I GET HELP? Get help right away if:  You have bad belly (abdominal) pain.  You are a man and have puffiness (swelling) or pain in your testicles.  You are a woman and have puffiness in your vagina. Document Released: 10/16/2004 Document Revised: 09/13/2013 Document Reviewed: 03/04/2013 [facility] Patient Information 2015 [place], [place]. This information is not intended to replace advice given to you by your health care provider. Make sure you discuss any questions you have with your  health care provider.

## 2015-02-04 NOTE — ED Notes (Signed)
Pt c/o lower back pain and vaginal pain, states that it burns when urinating. Denies d/c or bleeding.

## 2015-02-05 LAB — HIV ANTIBODY (ROUTINE TESTING W REFLEX): HIV SCREEN 4TH GENERATION: NONREACTIVE

## 2015-02-05 LAB — RPR: RPR Ser Ql: NONREACTIVE

## 2015-02-05 LAB — GC/CHLAMYDIA PROBE AMP (~~LOC~~) NOT AT ARMC
Chlamydia: NEGATIVE
Neisseria Gonorrhea: NEGATIVE

## 2015-05-18 ENCOUNTER — Encounter (HOSPITAL_COMMUNITY)
Admission: RE | Admit: 2015-05-18 | Discharge: 2015-05-18 | Disposition: A | Discharge status: Home / Self Care | Payer: Medicaid Other | Source: Ambulatory Visit | Attending: Oral Surgery | Admitting: Oral Surgery

## 2015-05-18 ENCOUNTER — Encounter (HOSPITAL_COMMUNITY): Payer: Self-pay

## 2015-05-18 DIAGNOSIS — K011 Impacted teeth: Secondary | ICD-10-CM | POA: Diagnosis not present

## 2015-05-18 DIAGNOSIS — D649 Anemia, unspecified: Secondary | ICD-10-CM | POA: Insufficient documentation

## 2015-05-18 DIAGNOSIS — Z6841 Body Mass Index (BMI) 40.0 and over, adult: Secondary | ICD-10-CM | POA: Insufficient documentation

## 2015-05-18 DIAGNOSIS — Z01818 Encounter for other preprocedural examination: Secondary | ICD-10-CM | POA: Diagnosis not present

## 2015-05-18 DIAGNOSIS — Z01812 Encounter for preprocedural laboratory examination: Secondary | ICD-10-CM | POA: Diagnosis not present

## 2015-05-18 DIAGNOSIS — K219 Gastro-esophageal reflux disease without esophagitis: Secondary | ICD-10-CM | POA: Insufficient documentation

## 2015-05-18 HISTORY — DX: Adverse effect of unspecified anesthetic, initial encounter: T41.45XA

## 2015-05-18 HISTORY — DX: Gastro-esophageal reflux disease without esophagitis: K21.9

## 2015-05-18 HISTORY — DX: Other specified postprocedural states: Z98.890

## 2015-05-18 HISTORY — DX: Other complications of anesthesia, initial encounter: T88.59XA

## 2015-05-18 HISTORY — DX: Other specified postprocedural states: R11.2

## 2015-05-18 LAB — CBC
HEMATOCRIT: 33.6 % — AB (ref 36.0–46.0)
HEMOGLOBIN: 9.8 g/dL — AB (ref 12.0–15.0)
MCH: 22.6 pg — AB (ref 26.0–34.0)
MCHC: 29.2 g/dL — AB (ref 30.0–36.0)
MCV: 77.4 fL — ABNORMAL LOW (ref 78.0–100.0)
Platelets: 374 10*3/uL (ref 150–400)
RBC: 4.34 MIL/uL (ref 3.87–5.11)
RDW: 16.1 % — ABNORMAL HIGH (ref 11.5–15.5)
WBC: 11.4 10*3/uL — ABNORMAL HIGH (ref 4.0–10.5)

## 2015-05-18 LAB — BASIC METABOLIC PANEL
ANION GAP: 7 (ref 5–15)
BUN: 7 mg/dL (ref 6–20)
CO2: 25 mmol/L (ref 22–32)
Calcium: 9 mg/dL (ref 8.9–10.3)
Chloride: 107 mmol/L (ref 101–111)
Creatinine, Ser: 0.67 mg/dL (ref 0.44–1.00)
GFR calc Af Amer: >60 mL/min (ref >60)
GFR calc non Af Amer: >60 mL/min (ref >60)
GLUCOSE: 122 mg/dL — AB (ref 65–99)
POTASSIUM: 3.7 mmol/L (ref 3.5–5.1)
Sodium: 139 mmol/L (ref 135–145)

## 2015-05-18 LAB — HCG, SERUM, QUALITATIVE: Preg, Serum: NEGATIVE

## 2015-05-18 NOTE — Pre-Procedure Instructions (Addendum)
CHAMIA SCHMUTZ  05/18/2015      [facility] [address] [address], [place] - [address] AT [facility] OF [facility] [address] [place] [address] Phone: [phone] Fax: [phone]    Your procedure is scheduled on 05/25/15.  [facility] to [facility] short stay admitting at 700 A.M.  Call this number if you have problems the morning of surgery:  [phone]   Remember:  Do not eat food or drink liquids after midnight.  Take these medicines the morning of surgery with A SIP OF WATER hydrocodone if needed   STOP all herbel meds, nsaids (aleve,naproxen,advil,ibuprofen) starting  Now including aspirin ,vitamins   Do not wear jewelry, make-up or nail polish.  Do not wear lotions, powders, or perfumes.  You may wear deodorant.  Do not shave 48 hours prior to surgery.  Men may shave face and neck.  Do not bring valuables to the hospital.  [facility] is not responsible for any belongings or valuables.  Contacts, dentures or bridgework may not be worn into surgery.  Leave your suitcase in the car.  After surgery it may be brought to your room.  For patients admitted to the hospital, discharge time will be determined by your treatment team.  Patients discharged the day of surgery will not be allowed to drive home.   Name and phone number of your driver:   Special instructions:  Special Instructions: [place] - Preparing for Surgery  Before surgery, you can play an important role.  Because skin is not sterile, your skin needs to be as free of germs as possible.  You can reduce the number of germs on you skin by washing with CHG (chlorahexidine gluconate) soap before surgery.  CHG is an antiseptic cleaner which kills germs and bonds with the skin to continue killing germs even after washing.  Please DO NOT use if you have an allergy to CHG or antibacterial soaps.  If your skin becomes reddened/irritated stop using the CHG and inform your nurse when you arrive at  Short Stay.  Do not shave (including legs and underarms) for at least 48 hours prior to the first CHG shower.  You may shave your face.  Please follow these instructions carefully:   1.  Shower with CHG Soap the night before surgery and the morning of Surgery.  2.  If you choose to wash your hair, wash your hair first as usual with your normal shampoo.  3.  After you shampoo, rinse your hair and body thoroughly to remove the Shampoo.  4.  Use CHG as you would any other liquid soap.  You can apply chg directly  to the skin and wash gently with scrungie or a clean washcloth.  5.  Apply the CHG Soap to your body ONLY FROM THE NECK DOWN.  Do not use on open wounds or open sores.  Avoid contact with your eyes ears, mouth and genitals (private parts).  Wash genitals (private parts)       with your normal soap.  6.  Wash thoroughly, paying special attention to the area where your surgery will be performed.  7.  Thoroughly rinse your body with warm water from the neck down.  8.  DO NOT shower/wash with your normal soap after using and rinsing off the CHG Soap.  9.  Pat yourself dry with a clean towel.            10.  Wear  clean pajamas.            11.  Place clean sheets on your bed the night of your first shower and do not sleep with pets.  Day of Surgery  Do not apply any lotions/deodorants the morning of surgery.  Please wear clean clothes to the hospital/surgery center.  Please read over the following fact sheets that you were given. Pain Booklet, Coughing and Deep Breathing and Surgical Site Infection Prevention

## 2015-05-21 NOTE — Progress Notes (Signed)
Anesthesia Chart Review: Patient is a 33 year old female scheduled for extraction impacted wisdom teeth on 05/25/15 by Dr. Barbette Merino.  History includes super morbid obesity (BMI 62.51), smoking, post-operative N/V, GERD, pregnancy induced hypertension '13, cholecystectomy, c-section X 3. No PCP is listed.   Meds include ferrous sulfate, Norco, ibuprofen.  Preoperative labs noted. Cr 0.67. Glucose 122. H/H 9.8/33.6. No recent comparison labs (Hgb 8.1-10.9 in Epic since 11/2007, but also in the setting of pregnancy.) LMP: 05/18/15. Serum pregnancy test was negative. She reports known history of "low iron" but admits to not having routine medical or GYN follow-up for this. She continues to take oral iron. She denied SOB, dizziness, hematochezia. We discuss importance of getting re-established with PCP or GYN for management of her anemia and rule out other causes as felt appropriate.     I notified Samantha at Dr. Randa Evens office about H/H. Anemia likely has a chronic component, but difficult to tell if there has been worsening since there are no recent labs to compare. Recent menses also probably effecting. She has been asymptomatic of her anemia. Would defer decision for pre-operative medical evaluation to Dr. Barbette Merino.  As long as patient is staying asymptomatic from her anemia, she could likely proceed as planned if no other acute issues. Plan discussed with anesthesiologist Dr. Glade Stanford.  Velna Ochs [facility] Short Stay Center/Anesthesiology Phone [phone] 05/21/2015 2:29 PM

## 2015-05-23 NOTE — H&P (Signed)
HISTORY AND PHYSICAL  Sarah Nash is a 34 y.o. female patient with CC: painful wisdom teeth  No diagnosis found.  Past Medical History  Diagnosis Date  . Obesity   . Abnormal Pap smear 2006  . Chlamydia april 2013  . Complication of anesthesia     nausea and vomiting with c sections no problem with gallbladder surg  . PONV (postoperative nausea and vomiting)   . Pregnancy induced hypertension     "they are watching my b/p"  . Depression     denies  . GERD (gastroesophageal reflux disease)     hx    No current facility-administered medications for this encounter.   Current Outpatient Prescriptions  Medication Sig Dispense Refill  . ferrous sulfate 325 (65 FE) MG tablet Take 325 mg by mouth daily.    Marland Kitchen ibuprofen (ADVIL,MOTRIN) 800 MG tablet Take 1 tablet (800 mg total) by mouth 3 (three) times daily. (Patient taking differently: Take 800 mg by mouth 2 (two) times daily as needed (for pain). ) 21 tablet 0  . HYDROcodone-acetaminophen (NORCO/VICODIN) 5-325 MG per tablet Take 1 tablet by mouth every 4 (four) hours as needed for moderate pain or severe pain. (Patient not taking: Reported on 02/04/2015) 15 tablet 0   Allergies  Allergen Reactions  . Bee Venom Hives, Itching, Swelling and Other (See Comments)    *Swelling at site and swelling of face *bruising    Active Problems:   * No active hospital problems. *  Vitals: currently breastfeeding. Lab results:No results found for this or any previous visit (from the past 24 hour(s)). Radiology Results: No results found. General appearance: alert, cooperative, no distress and morbidly obese Head: Normocephalic, without obvious abnormality, atraumatic Eyes: negative Nose: Nares normal. Septum midline. Mucosa normal. No drainage or sinus tenderness. Throat: lips, mucosa, and tongue normal; teeth and gums normal and impacted teeth # 1, 16, 17, 32. No purulence, edema Neck: no adenopathy, supple, symmetrical, trachea midline and  thyroid not enlarged, symmetric, no tenderness/mass/nodules Resp: clear to auscultation bilaterally Cardio: regular rate and rhythm, S1, S2 normal, no murmur, click, rub or gallop  Assessment: Impacted teeth 1, 16, 17, 32, morbid obesity  Plan: extraction teeth 1, 16, 17, 32. General anesthesia. Day surgery.   [name] 05/23/2015

## 2015-05-25 ENCOUNTER — Ambulatory Visit (HOSPITAL_COMMUNITY): Payer: Medicaid Other | Admitting: Certified Registered Nurse Anesthetist

## 2015-05-25 ENCOUNTER — Ambulatory Visit (HOSPITAL_COMMUNITY)
Admission: RE | Admit: 2015-05-25 | Discharge: 2015-05-25 | Disposition: A | Discharge status: Home / Self Care | Payer: Medicaid Other | Source: Ambulatory Visit | Attending: Oral Surgery | Admitting: Oral Surgery

## 2015-05-25 ENCOUNTER — Encounter (HOSPITAL_COMMUNITY): Payer: Self-pay | Admitting: *Deleted

## 2015-05-25 ENCOUNTER — Ambulatory Visit (HOSPITAL_COMMUNITY): Payer: Medicaid Other | Admitting: Vascular Surgery

## 2015-05-25 ENCOUNTER — Encounter (HOSPITAL_COMMUNITY)
Admission: RE | Disposition: A | Discharge status: Home / Self Care | Payer: Self-pay | Source: Ambulatory Visit | Attending: Oral Surgery

## 2015-05-25 DIAGNOSIS — K011 Impacted teeth: Secondary | ICD-10-CM | POA: Diagnosis present

## 2015-05-25 DIAGNOSIS — Z6841 Body Mass Index (BMI) 40.0 and over, adult: Secondary | ICD-10-CM | POA: Insufficient documentation

## 2015-05-25 DIAGNOSIS — Z91030 Bee allergy status: Secondary | ICD-10-CM | POA: Insufficient documentation

## 2015-05-25 DIAGNOSIS — K219 Gastro-esophageal reflux disease without esophagitis: Secondary | ICD-10-CM | POA: Diagnosis not present

## 2015-05-25 HISTORY — PX: TOOTH EXTRACTION: SHX859

## 2015-05-25 SURGERY — EXTRACTION, TOOTH, MOLAR
Anesthesia: General | Site: Mouth

## 2015-05-25 MED ORDER — FENTANYL CITRATE (PF) 100 MCG/2ML IJ SOLN
25.0000 ug | INTRAMUSCULAR | Status: DC | PRN
  Administered 2015-05-25: 25 ug via INTRAVENOUS
  Administered 2015-05-25: 50 ug via INTRAVENOUS

## 2015-05-25 MED ORDER — DEXTROSE 5 % IV SOLN
3.0000 g | INTRAVENOUS | Status: AC
  Administered 2015-05-25: 3 g via INTRAVENOUS
  Filled 2015-05-25 (×2): qty 3000

## 2015-05-25 MED ORDER — LACTATED RINGERS IV SOLN
INTRAVENOUS | Status: DC
  Administered 2015-05-25: 08:00:00 via INTRAVENOUS

## 2015-05-25 MED ORDER — PROPOFOL 10 MG/ML IV BOLUS
INTRAVENOUS | Status: AC
  Filled 2015-05-25: qty 20

## 2015-05-25 MED ORDER — PHENOL 1.4 % MT LIQD
1.0000 | OROMUCOSAL | Status: DC | PRN
  Administered 2015-05-25: 1 via OROMUCOSAL
  Filled 2015-05-25: qty 177

## 2015-05-25 MED ORDER — OXYCODONE HCL 5 MG PO TABS: 5.0000 mg | ORAL_TABLET | Freq: Once | ORAL | Status: AC | PRN

## 2015-05-25 MED ORDER — DEXAMETHASONE SODIUM PHOSPHATE 10 MG/ML IJ SOLN
INTRAMUSCULAR | Status: AC
  Filled 2015-05-25: qty 1

## 2015-05-25 MED ORDER — ONDANSETRON HCL 4 MG/2ML IJ SOLN
INTRAMUSCULAR | Status: DC | PRN
  Administered 2015-05-25: 4 mg via INTRAVENOUS

## 2015-05-25 MED ORDER — OXYMETAZOLINE HCL 0.05 % NA SOLN
NASAL | Status: DC | PRN
  Administered 2015-05-25: 2 via NASAL

## 2015-05-25 MED ORDER — SUCCINYLCHOLINE CHLORIDE 20 MG/ML IJ SOLN
INTRAMUSCULAR | Status: AC
  Filled 2015-05-25: qty 2

## 2015-05-25 MED ORDER — MIDAZOLAM HCL 2 MG/2ML IJ SOLN
INTRAMUSCULAR | Status: AC
  Filled 2015-05-25: qty 4

## 2015-05-25 MED ORDER — OXYCODONE HCL 5 MG/5ML PO SOLN
5.0000 mg | Freq: Once | ORAL | Status: AC | PRN
  Administered 2015-05-25: 5 mg via ORAL

## 2015-05-25 MED ORDER — ACETAMINOPHEN 160 MG/5ML PO SOLN
325.0000 mg | ORAL | Status: DC | PRN
  Filled 2015-05-25: qty 20.3

## 2015-05-25 MED ORDER — SUCCINYLCHOLINE CHLORIDE 20 MG/ML IJ SOLN
INTRAMUSCULAR | Status: DC | PRN
  Administered 2015-05-25: 60 mg via INTRAVENOUS
  Administered 2015-05-25: 80 mg via INTRAVENOUS
  Administered 2015-05-25: 40 mg via INTRAVENOUS

## 2015-05-25 MED ORDER — PHENOL 1.4 % MT LIQD: 1.0000 | OROMUCOSAL | Status: DC | PRN

## 2015-05-25 MED ORDER — DEXAMETHASONE SODIUM PHOSPHATE 10 MG/ML IJ SOLN
INTRAMUSCULAR | Status: DC | PRN
  Administered 2015-05-25: 10 mg via INTRAVENOUS

## 2015-05-25 MED ORDER — MIDAZOLAM HCL 5 MG/5ML IJ SOLN
INTRAMUSCULAR | Status: DC | PRN
  Administered 2015-05-25: 2 mg via INTRAVENOUS

## 2015-05-25 MED ORDER — OXYCODONE HCL 5 MG/5ML PO SOLN
ORAL | Status: AC
  Filled 2015-05-25: qty 5

## 2015-05-25 MED ORDER — LIDOCAINE HCL (CARDIAC) 20 MG/ML IV SOLN
INTRAVENOUS | Status: DC | PRN
  Administered 2015-05-25: 100 mg via INTRAVENOUS

## 2015-05-25 MED ORDER — OXYMETAZOLINE HCL 0.05 % NA SOLN
NASAL | Status: AC
  Filled 2015-05-25: qty 15

## 2015-05-25 MED ORDER — ONDANSETRON HCL 4 MG/2ML IJ SOLN
INTRAMUSCULAR | Status: AC
  Filled 2015-05-25: qty 2

## 2015-05-25 MED ORDER — OXYMETAZOLINE HCL 0.05 % NA SOLN
NASAL | Status: DC | PRN
  Administered 2015-05-25: 1

## 2015-05-25 MED ORDER — LIDOCAINE-EPINEPHRINE 2 %-1:100000 IJ SOLN
INTRAMUSCULAR | Status: AC
  Filled 2015-05-25: qty 2

## 2015-05-25 MED ORDER — FENTANYL CITRATE (PF) 100 MCG/2ML IJ SOLN
INTRAMUSCULAR | Status: AC
  Filled 2015-05-25: qty 2

## 2015-05-25 MED ORDER — FENTANYL CITRATE (PF) 250 MCG/5ML IJ SOLN
INTRAMUSCULAR | Status: AC
  Filled 2015-05-25: qty 5

## 2015-05-25 MED ORDER — LIDOCAINE-EPINEPHRINE 2 %-1:100000 IJ SOLN
INTRAMUSCULAR | Status: AC
  Filled 2015-05-25: qty 1

## 2015-05-25 MED ORDER — ROCURONIUM BROMIDE 50 MG/5ML IV SOLN
INTRAVENOUS | Status: AC
  Filled 2015-05-25: qty 1

## 2015-05-25 MED ORDER — FENTANYL CITRATE (PF) 100 MCG/2ML IJ SOLN
INTRAMUSCULAR | Status: DC | PRN
  Administered 2015-05-25: 100 ug via INTRAVENOUS
  Administered 2015-05-25: 150 ug via INTRAVENOUS

## 2015-05-25 MED ORDER — OXYCODONE-ACETAMINOPHEN 5-325 MG PO TABS: 1.0000 | ORAL_TABLET | ORAL | Status: DC | PRN

## 2015-05-25 MED ORDER — PROPOFOL 10 MG/ML IV BOLUS
INTRAVENOUS | Status: DC | PRN
  Administered 2015-05-25 (×2): 100 mg via INTRAVENOUS
  Administered 2015-05-25: 200 mg via INTRAVENOUS

## 2015-05-25 MED ORDER — ACETAMINOPHEN 325 MG PO TABS: 325.0000 mg | ORAL_TABLET | ORAL | Status: DC | PRN

## 2015-05-25 MED ORDER — ARTIFICIAL TEARS OP OINT
TOPICAL_OINTMENT | OPHTHALMIC | Status: AC
  Filled 2015-05-25: qty 3.5

## 2015-05-25 SURGICAL SUPPLY — 30 items
BLADE SURG 15 STRL LF DISP TIS (BLADE) ×1 IMPLANT
BLADE SURG 15 STRL SS (BLADE) ×3
BUR CROSS CUT FISSURE 1.6 (BURR) ×2 IMPLANT
BUR CROSS CUT FISSURE 1.6MM (BURR) ×1
CANISTER SUCTION 2500CC (MISCELLANEOUS) ×3 IMPLANT
COVER SURGICAL LIGHT HANDLE (MISCELLANEOUS) ×3 IMPLANT
GAUZE PACKING FOLDED 2  STR (GAUZE/BANDAGES/DRESSINGS) ×2
GAUZE PACKING FOLDED 2 STR (GAUZE/BANDAGES/DRESSINGS) ×1 IMPLANT
GAUZE SPONGE 4X4 16PLY XRAY LF (GAUZE/BANDAGES/DRESSINGS) IMPLANT
GLOVE BIO SURGEON STRL SZ 6.5 (GLOVE) ×2 IMPLANT
GLOVE BIO SURGEON STRL SZ7.5 (GLOVE) ×3 IMPLANT
GLOVE BIO SURGEONS STRL SZ 6.5 (GLOVE) ×1
GLOVE BIOGEL PI IND STRL 7.0 (GLOVE) ×1 IMPLANT
GLOVE BIOGEL PI INDICATOR 7.0 (GLOVE) ×2
GOWN STRL REUS W/ TWL LRG LVL3 (GOWN DISPOSABLE) ×1 IMPLANT
GOWN STRL REUS W/ TWL XL LVL3 (GOWN DISPOSABLE) ×1 IMPLANT
GOWN STRL REUS W/TWL LRG LVL3 (GOWN DISPOSABLE) ×3
GOWN STRL REUS W/TWL XL LVL3 (GOWN DISPOSABLE) ×3
KIT BASIN OR (CUSTOM PROCEDURE TRAY) ×3 IMPLANT
KIT ROOM TURNOVER OR (KITS) ×3 IMPLANT
NEEDLE 22X1 1/2 (OR ONLY) (NEEDLE) ×3 IMPLANT
NS IRRIG 1000ML POUR BTL (IV SOLUTION) ×3 IMPLANT
PAD ARMBOARD 7.5X6 YLW CONV (MISCELLANEOUS) ×6 IMPLANT
SUT CHROMIC 3 0 PS 2 (SUTURE) ×6 IMPLANT
TOWEL OR 17X26 10 PK STRL BLUE (TOWEL DISPOSABLE) ×3 IMPLANT
TRAY ENT MC OR (CUSTOM PROCEDURE TRAY) ×3 IMPLANT
TUBE CONNECTING 12'X1/4 (SUCTIONS)
TUBE CONNECTING 12X1/4 (SUCTIONS) IMPLANT
TUBING IRRIGATION (MISCELLANEOUS) IMPLANT
YANKAUER SUCT BULB TIP NO VENT (SUCTIONS) ×3 IMPLANT

## 2015-05-25 NOTE — Op Note (Signed)
05/25/2015  10:25 AM  PATIENT:  Sarah Nash  34 y.o. female  PRE-OPERATIVE DIAGNOSIS:  impacted teeth #1, #16, #17, and #32  POST-OPERATIVE DIAGNOSIS:  SAME  PROCEDURE:  Procedure(s): WISDOM TEETH EXTRACTIONS  SURGEON:  Surgeon(s): Ocie Doyne, DDS  ANESTHESIA:   local and general  EBL:  minimal  DRAINS: none   SPECIMEN:  No Specimen  COUNTS:  YES  PLAN OF CARE: Discharge to home after PACU  PATIENT DISPOSITION:  PACU - hemodynamically stable.   PROCEDURE DETAILS: Dictation # [id number]  [name], DMD 05/25/2015 10:25 AM

## 2015-05-25 NOTE — Op Note (Signed)
NAME:  Sarah Nash, Sarah Nash NO.:  [id number]  MEDICAL RECORD NO.:  [id number]  LOCATION:  [facility]                         FACILITY:  [facility]  PHYSICIAN:  [name], M.D.  DATE OF BIRTH:  March 19, 1981  DATE OF PROCEDURE:  05/25/2015 DATE OF DISCHARGE:  05/25/2015                              OPERATIVE REPORT   PREOPERATIVE DIAGNOSIS:  Impacted teeth numbers 1, 16, 17, 32.  POSTOPERATIVE DIAGNOSIS:  Impacted teeth numbers 1, 16, 17, 32.  PROCEDURE:  Extraction of teeth #1, #16, #17, and #32.  SURGEON:  [name], M.D.  ANESTHESIA:  General, Dr. Maple Hudson, attending.  DESCRIPTION OF PROCEDURE:  This patient was taken to the operating room, placed on the table in supine position.  General anesthesia was administered and then after 2 failed nasal endotracheal intubation attempts, an oral endotracheal tube was placed and secured.  The eyes were protected and the patient was draped for the procedure.  Time-out was performed.  The posterior pharynx was suctioned and a throat pack was placed.  2% lidocaine with 1:100,000 epinephrine was infiltrated in the inferior alveolar block on the right and left sides and buccal and palatal infiltration in the maxilla, total of 12 mL was utilized.  Bite block was placed in the right side of the mouth and a Sweetheart retractor was used to retract the tongue.  A 15 blade was used to make an incision overlying teeth #17 and #16.  The periosteum was elevated with a periosteal elevator.  Bone was removed from around both of these teeth and then the teeth were elevated with a 301 elevator.  Very little mobility was achieved during elevation.  E type elevator was used and then the teeth were grasped with the forceps, however, both crowns fractured off during attempted removal with forceps.  Then, additional bone was removed around teeth #16 and #17, and the roots were removed using a 301 elevator.  Then, the sockets were curetted and  irrigated and closed with 3-0 chromic.  The endotracheal tube was repositioned to the left side of the mouth and a Sweetheart retractor was repositioned as well and bite block, then the 15 blade was used to make an incision overlying teeth #1 and #32.  The periosteum was reflected with a periosteal elevator.  Bone was removed from around these teeth.  The teeth were elevated with a 301 elevator and removed with the dental forceps.  The sockets were curetted, irrigated, and closed with 3-0 chromic.  Then, the oral cavity was irrigated and suctioned.  Throat pack was removed.  The patient was awakened, taken to the recovery room, breathing spontaneously in good condition.  ESTIMATED BLOOD LOSS:  Minimal.  COMPLICATIONS:  None.  SPECIMENS:  None.     [name], M.D.     SMJ/MEDQ  D:  05/25/2015  T:  05/25/2015  Job:  [id number]

## 2015-05-25 NOTE — Anesthesia Preprocedure Evaluation (Signed)
Anesthesia Evaluation  Patient identified by MRN, date of birth, ID band Patient awake    Reviewed: Allergy & Precautions, NPO status , Patient's Chart, lab work & pertinent test results  History of Anesthesia Complications (+) PONV and history of anesthetic complications  Airway Mallampati: II  TM Distance: >3 FB Neck ROM: Full    Dental  (+) Teeth Intact   Pulmonary neg shortness of breath, neg sleep apnea, neg COPDneg recent URI, Current Smoker,  breath sounds clear to auscultation        Cardiovascular negative cardio ROS  Rhythm:Regular     Neuro/Psych PSYCHIATRIC DISORDERS Depression negative neurological ROS     GI/Hepatic negative GI ROS, Neg liver ROS,   Endo/Other  Morbid obesity  Renal/GU negative Renal ROS     Musculoskeletal negative musculoskeletal ROS (+)   Abdominal   Peds  Hematology negative hematology ROS (+)   Anesthesia Other Findings   Reproductive/Obstetrics                             Anesthesia Physical Anesthesia Plan  ASA: II  Anesthesia Plan: General   Post-op Pain Management:    Induction: Intravenous  Airway Management Planned: Oral ETT and Nasal ETT  Additional Equipment: None  Intra-op Plan:   Post-operative Plan: Extubation in OR  Informed Consent: I have reviewed the patients History and Physical, chart, labs and discussed the procedure including the risks, benefits and alternatives for the proposed anesthesia with the patient or authorized representative who has indicated his/her understanding and acceptance.   Dental advisory given  Plan Discussed with: CRNA and Surgeon  Anesthesia Plan Comments:         Anesthesia Quick Evaluation

## 2015-05-25 NOTE — Transfer of Care (Signed)
Immediate Anesthesia Transfer of Care Note  Patient: Sarah Nash  Procedure(s) Performed: Procedure(s): WISDOM TEETH EXTRACTIONS (N/A)  Patient Location: PACU  Anesthesia Type:General  Level of Consciousness: awake, alert  and oriented  Airway & Oxygen Therapy: Patient Spontanous Breathing and Patient connected to face mask oxygen  Post-op Assessment: Report given to RN and Post -op Vital signs reviewed and stable  Post vital signs: Reviewed and stable  Last Vitals:  Filed Vitals:   05/25/15 1041  BP: 172/104  Pulse: 95  Temp: 36.3 C  Resp: 14    Complications: No apparent anesthesia complications

## 2015-05-25 NOTE — Anesthesia Procedure Notes (Signed)
Procedure Name: Intubation Date/Time: 05/25/2015 9:55 AM Performed by: Margaree Mackintosh Pre-anesthesia Checklist: Patient identified, Emergency Drugs available, Patient being monitored, Suction available and Timeout performed Patient Re-evaluated:Patient Re-evaluated prior to inductionOxygen Delivery Method: Circle system utilized Preoxygenation: Pre-oxygenation with 100% oxygen Intubation Type: IV induction Ventilation: Oral airway inserted - appropriate to patient size Laryngoscope Size: Mac and 4 Grade View: Grade I Tube type: Oral Tube size: 7.0 mm Number of attempts: 2 (see note) Airway Equipment and Method: Patient positioned with wedge pillow Placement Confirmation: ETT inserted through vocal cords under direct vision,  positive ETCO2 and breath sounds checked- equal and bilateral Secured at: 22 cm Tube secured with: Tape Dental Injury: Bloody posterior oropharynx  Comments: Surgeon ok with nasal or oral tube - attempted nasal 7.0 tube through left nare x2 (DL x2 with mac4 blade), vocal cords visualized, unsuccessful placement of nasal tube. Mask ventilation in between attempts, then DL with mac 4 blade and 7.0 oral tube placed without difficulty

## 2015-05-25 NOTE — H&P (Signed)
H&P documentation  -History and Physical Reviewed  -Patient has been re-examined  -No change in the plan of care  Sheree Lalla M  

## 2015-05-28 NOTE — Anesthesia Postprocedure Evaluation (Signed)
  Anesthesia Post-op Note  Patient: Sarah Nash  Procedure(s) Performed: Procedure(s): WISDOM TEETH EXTRACTIONS (N/A)  Patient Location: PACU  Anesthesia Type:General  Level of Consciousness: awake  Airway and Oxygen Therapy: Patient Spontanous Breathing  Post-op Pain: mild  Post-op Assessment: Post-op Vital signs reviewed, Patient's Cardiovascular Status Stable, Respiratory Function Stable, Patent Airway, No signs of Nausea or vomiting and Pain level controlled              Post-op Vital Signs: Reviewed and stable  Last Vitals:  Filed Vitals:   05/25/15 1143  BP: 158/123  Pulse: 86  Temp: 36.2 C  Resp: 20    Complications: No apparent anesthesia complications

## 2015-05-29 ENCOUNTER — Encounter (HOSPITAL_COMMUNITY): Payer: Self-pay | Admitting: Oral Surgery

## 2016-02-23 ENCOUNTER — Emergency Department (HOSPITAL_COMMUNITY): Payer: Medicaid Other

## 2016-02-23 ENCOUNTER — Emergency Department (HOSPITAL_COMMUNITY)
Admission: EM | Admit: 2016-02-23 | Discharge: 2016-02-23 | Disposition: A | Discharge status: Home / Self Care | Payer: Medicaid Other | Attending: Emergency Medicine | Admitting: Emergency Medicine

## 2016-02-23 ENCOUNTER — Encounter (HOSPITAL_COMMUNITY): Payer: Self-pay | Admitting: Emergency Medicine

## 2016-02-23 DIAGNOSIS — F329 Major depressive disorder, single episode, unspecified: Secondary | ICD-10-CM | POA: Insufficient documentation

## 2016-02-23 DIAGNOSIS — E669 Obesity, unspecified: Secondary | ICD-10-CM | POA: Diagnosis not present

## 2016-02-23 DIAGNOSIS — F17210 Nicotine dependence, cigarettes, uncomplicated: Secondary | ICD-10-CM | POA: Diagnosis not present

## 2016-02-23 DIAGNOSIS — M545 Low back pain, unspecified: Secondary | ICD-10-CM

## 2016-02-23 LAB — POC URINE PREG, ED: PREG TEST UR: NEGATIVE

## 2016-02-23 MED ORDER — MELOXICAM 7.5 MG PO TABS: 7.5000 mg | ORAL_TABLET | Freq: Every day | ORAL | Status: AC

## 2016-02-23 MED ORDER — HYDROCODONE-ACETAMINOPHEN 5-325 MG PO TABS: 1.0000 | ORAL_TABLET | ORAL | Status: DC | PRN

## 2016-02-23 MED ORDER — KETOROLAC TROMETHAMINE 60 MG/2ML IM SOLN
60.0000 mg | Freq: Once | INTRAMUSCULAR | Status: AC
  Administered 2016-02-23: 60 mg via INTRAMUSCULAR
  Filled 2016-02-23: qty 2

## 2016-02-23 NOTE — ED Notes (Signed)
Pt c/o lower back pain. Pt states she stood up this morning and felt her lower back pop. Took a hydrocodone approx 11 with no relief.

## 2016-02-23 NOTE — ED Provider Notes (Signed)
CSN: [id number]     Arrival date & time 02/23/16  1834 History  By signing my name below, I, Sarah Nash, attest that this documentation has been prepared under the direction and in the presence of [facility] ToledoEmily Meka Lewan, PA-C. Electronically Signed: Ronney LionSuzanne Nash, ED Scribe. 02/23/2016. 9:34 PM.    Chief Complaint  Patient presents with  . Back Pain   The history is provided by the patient. No language interpreter was used.    HPI Comments: Sarah Nash is a 35 y.o. female with a history of obesity, who presents to the Emergency Department complaining of non-radiating, sharp, 9/10 lower back pain after standing up and hearing a pop this morning. She characterizes her pain as feeling like, "I'm getting kicked in my lower back." She states she had tried her mother's hydrocodone with no relief. Patient reports a history of slipped disc several years ago. However, she states she was diagnosed by physical exam only and did not have any imaging done at that time. Movement exacerbates her pain. Patient states she is able to walk, although doing so causes increased pain. She denies extremity weakness or numbness, bowel or bladder incontinence, urinary symptoms, vaginal symptoms, bowel symptoms, abdominal pain, or fever.   PCP: None  Past Medical History  Diagnosis Date  . Obesity   . Abnormal Pap smear 2006  . Chlamydia april 2013  . Complication of anesthesia     nausea and vomiting with c sections no problem with gallbladder surg  . PONV (postoperative nausea and vomiting)   . Pregnancy induced hypertension     "they are watching my b/p"  . Depression     denies  . GERD (gastroesophageal reflux disease)     hx   Past Surgical History  Procedure Laterality Date  . Cesarean section  [id number]  . Cesarean section  06/22/2012    Procedure: CESAREAN SECTION;  Surgeon: Reva Boresanya S Pratt, MD;  Location: [facility];  Service: Obstetrics;  Laterality: N/A;  . Cholecystectomy  07/14/2012    Procedure: LAPAROSCOPIC  CHOLECYSTECTOMY;  Surgeon: Robyne AskewPaul S Toth III, MD;  Location: [facility];  Service: General;  Laterality: N/A;  . Tooth extraction N/A 05/25/2015    Procedure: WISDOM TEETH EXTRACTIONS;  Surgeon: Ocie DoyneScott Jensen, DDS;  [facility];  Service: Oral Surgery;  Laterality: N/A;   Family History  Problem Relation Age of Onset  . Anesthesia problems Neg Hx   . Hypertension Father   . Mental illness Father    Social History  Substance Use Topics  . Smoking status: Current Some Day Smoker    Types: Cigarettes  . Smokeless tobacco: Never Used     Comment: occ cigarette  . Alcohol Use: No   OB History    Gravida Para Term Preterm AB TAB SAB Ectopic Multiple Living   3 3 3       3      Review of Systems  Constitutional: Positive for activity change. Negative for fever.  Gastrointestinal: Negative.  Negative for abdominal pain, diarrhea, constipation and blood in stool.  Genitourinary: Negative.  Negative for dysuria, urgency, frequency, vaginal bleeding and vaginal discharge.  Musculoskeletal: Positive for back pain. Negative for myalgias and neck pain.  Skin: Negative for color change.  Neurological: Negative for weakness and numbness.  Hematological: Does not bruise/bleed easily.  Psychiatric/Behavioral: Negative for self-injury.   Allergies  Bee venom  Home Medications   Prior to Admission medications   Medication Sig Start Date End Date Taking? Authorizing Provider  ferrous sulfate 325 (65 FE) MG tablet Take 325 mg by mouth daily.    Historical Provider, MD  ibuprofen (ADVIL,MOTRIN) 800 MG tablet Take 1 tablet (800 mg total) by mouth 3 (three) times daily. Patient taking differently: Take 800 mg by mouth 2 (two) times daily as needed (for pain).  10/31/14   Mellody Drown, PA-C  oxyCODONE-acetaminophen (PERCOCET) 5-325 MG per tablet Take 1-2 tablets by mouth every 4 (four) hours as needed for severe pain. 05/25/15   Ocie Doyne, DDS   BP 169/102 mmHg  Pulse 97  Temp(Src) 98.3 F (36.8 C)  (Oral)  Resp 24  SpO2 100% Physical Exam  Constitutional: She appears well-developed and well-nourished. No distress.  Exam limited secondary to body habitus. Patient is morbidly obese.  HENT:  Head: Normocephalic and atraumatic.  Neck: Neck supple.  Pulmonary/Chest: Effort normal.  Abdominal: Soft. She exhibits no distension and no mass. There is no tenderness. There is no rebound and no guarding.  Musculoskeletal: She exhibits tenderness.  Exam limited secondary to body habitus.  Lower extremities:  Strength 5/5, sensation intact, distal pulses intact.    Point tenderness in lumbar spine.   Neurological: She is alert.  Skin: She is not diaphoretic.  Nursing note and vitals reviewed. Ambulates slowly but evenly without assistance.   ED Course  Procedures (including critical care time)  DIAGNOSTIC STUDIES: Oxygen Saturation is 100% on RA, normal by my interpretation.    COORDINATION OF CARE: 8:22 PM - Discussed treatment plan with pt at bedside which includes Toradol injection and lumbar spine x-ray. Pt verbalized understanding and agreed to plan.   Labs Review Labs Reviewed  POC URINE PREG, ED   Imaging Review Dg Lumbar Spine Complete  02/23/2016  CLINICAL DATA:  Acute onset sharp in character low back pain EXAM: LUMBAR SPINE - COMPLETE 4+ VIEW COMPARISON:  None. FINDINGS: Frontal, lateral, spot lumbosacral lateral, and bilateral oblique views were obtained. There are 5 non-rib-bearing lumbar type vertebral bodies. There is no fracture or spondylolisthesis. There is mild disc space narrowing at L4-5. Other disc spaces appear normal. There is no appreciable facet arthropathy. IMPRESSION: Disc space narrowing L4-5.  No fracture or spondylolisthesis. Electronically Signed   By: Bretta Bang III M.D.   On: 02/23/2016 21:26   I have personally reviewed and evaluated these images and lab results as part of my medical decision-making.  MDM   Final diagnoses:  Midline low back  pain without sciatica   Afebrile, nontoxic patient with mechanical low back pain that occurred immediately upon standing and feeling a pop.  Midline tenderness without radicular symptoms.  Exam somewhat limited due to large body habitus (202kg).  No red flags for back pain.  Xray shows disc space narrowing L4-5 without fracture.  Some relief with toradol.  Able to ambulate independently with normal but slow gait.  D/C home with mobic, norco, close PCP follow up.  Resources provided.  Discussed result, findings, treatment, and follow up  with patient.  Pt given return precautions.  Pt verbalizes understanding and agrees with plan.      I personally performed the services described in this documentation, which was scribed in my presence. The recorded information has been reviewed and is accurate.    Trixie Dredge, PA-C 02/23/16 2205  Lorre Nick, MD 02/24/16 2249

## 2016-02-23 NOTE — Discharge Instructions (Signed)
Read the information below.  Use the prescribed medication as directed.  Please discuss all new medications with your pharmacist.  Do not take additional tylenol while taking the prescribed pain medication to avoid overdose.  You may return to the Emergency Department at any time for worsening condition or any new symptoms that concern you.   If you develop fevers, loss of control of bowel or bladder, weakness or numbness in your legs, or are unable to walk, return to the ER for a recheck.   Back Pain, Adult Back pain is very common in adults.The cause of back pain is rarely dangerous and the pain often gets better over time.The cause of your back pain may not be known. Some common causes of back pain include:  Strain of the muscles or ligaments supporting the spine.  Wear and tear (degeneration) of the spinal disks.  Arthritis.  Direct injury to the back. For many people, back pain may return. Since back pain is rarely dangerous, most people can learn to manage this condition on their own. HOME CARE INSTRUCTIONS Watch your back pain for any changes. The following actions may help to lessen any discomfort you are feeling:  Remain active. It is stressful on your back to sit or stand in one place for long periods of time. Do not sit, drive, or stand in one place for more than 30 minutes at a time. Take short walks on even surfaces as soon as you are able.Try to increase the length of time you walk each day.  Exercise regularly as directed by your health care provider. Exercise helps your back heal faster. It also helps avoid future injury by keeping your muscles strong and flexible.  Do not stay in bed.Resting more than 1-2 days can delay your recovery.  Pay attention to your body when you bend and lift. The most comfortable positions are those that put less stress on your recovering back. Always use proper lifting techniques, including:  Bending your knees.  Keeping the load close to  your body.  Avoiding twisting.  Find a comfortable position to sleep. Use a firm mattress and lie on your side with your knees slightly bent. If you lie on your back, put a pillow under your knees.  Avoid feeling anxious or stressed.Stress increases muscle tension and can worsen back pain.It is important to recognize when you are anxious or stressed and learn ways to manage it, such as with exercise.  Take medicines only as directed by your health care provider. Over-the-counter medicines to reduce pain and inflammation are often the most helpful.Your health care provider may prescribe muscle relaxant drugs.These medicines help dull your pain so you can more quickly return to your normal activities and healthy exercise.  Apply ice to the injured area:  Put ice in a plastic bag.  Place a towel between your skin and the bag.  Leave the ice on for 20 minutes, 2-3 times a day for the first 2-3 days. After that, ice and heat may be alternated to reduce pain and spasms.  Maintain a healthy weight. Excess weight puts extra stress on your back and makes it difficult to maintain good posture. SEEK MEDICAL CARE IF:  You have pain that is not relieved with rest or medicine.  You have increasing pain going down into the legs or buttocks.  You have pain that does not improve in one week.  You have night pain.  You lose weight.  You have a fever or chills. SEEK  IMMEDIATE MEDICAL CARE IF:   You develop new bowel or bladder control problems.  You have unusual weakness or numbness in your arms or legs.  You develop nausea or vomiting.  You develop abdominal pain.  You feel faint.   This information is not intended to replace advice given to you by your health care provider. Make sure you discuss any questions you have with your health care provider.   Document Released: 09/08/2005 Document Revised: 09/29/2014 Document Reviewed: 01/10/2014 Elsevier Interactive Patient Education 2016  [place] Injury Prevention Back injuries can be very painful. They can also be difficult to heal. After having one back injury, you are more likely to injure your back again. It is important to learn how to avoid injuring or re-injuring your back. The following tips can help you to prevent a back injury. WHAT SHOULD I KNOW ABOUT PHYSICAL FITNESS?  Exercise for 30 minutes per day on most days of the week or as directed by your health care provider. Make sure to:  Do aerobic exercises, such as walking, jogging, biking, or swimming.  Do exercises that increase balance and strength, such as tai chi and yoga. These can decrease your risk of falling and injuring your back.  Do stretching exercises to help with flexibility.  Try to develop strong abdominal muscles. Your abdominal muscles provide a lot of the support that is needed by your back.  Maintain a healthy weight. This helps to decrease your risk of a back injury. WHAT SHOULD I KNOW ABOUT MY DIET?  Talk with your health care provider about your overall diet. Take supplements and vitamins only as directed by your health care provider.  Talk with your health care provider about how much calcium and vitamin D you need each day. These nutrients help to prevent weakening of the bones (osteoporosis). Osteoporosis can cause broken (fractured) bones, which lead to back pain.  Include good sources of calcium in your diet, such as dairy products, green leafy vegetables, and products that have had calcium added to them (fortified).  Include good sources of vitamin D in your diet, such as milk and foods that are fortified with vitamin D. WHAT SHOULD I KNOW ABOUT MY POSTURE?  Sit up straight and stand up straight. Avoid leaning forward when you sit or hunching over when you stand.  Choose chairs that have good low-back (lumbar) support.  If you work at a desk, sit close to it so you do not need to lean over. Keep your chin tucked in.  Keep your neck drawn back, and keep your elbows bent at a right angle. Your arms should look like the letter "L."  Sit high and close to the steering wheel when you drive. Add a lumbar support to your car seat, if needed.  Avoid sitting or standing in one position for very long. Take breaks to get up, stretch, and walk around at least one time every hour. Take breaks every hour if you are driving for long periods of time.  Sleep on your side with your knees slightly bent, or sleep on your back with a pillow under your knees. Do not lie on the front of your body to sleep. WHAT SHOULD I KNOW ABOUT LIFTING, TWISTING, AND REACHING? Lifting and Heavy Lifting  Avoid heavy lifting, especially repetitive heavy lifting. If you must do heavy lifting:  Stretch before lifting.  Work slowly.  Rest between lifts.  Use a tool such as a cart or a dolly to  move objects if one is available.  Make several small trips instead of carrying one heavy load.  Ask for help when you need it, especially when moving big objects.  Follow these steps when lifting:  Stand with your feet shoulder-width apart.  Get as close to the object as you can. Do not try to pick up a heavy object that is far from your body.  Use handles or lifting straps if they are available.  Bend at your knees. Squat down, but keep your heels off the floor.  Keep your shoulders pulled back, your chin tucked in, and your back straight.  Lift the object slowly while you tighten the muscles in your legs, abdomen, and buttocks. Keep the object as close to the center of your body as possible.  Follow these steps when putting down a heavy load:  Stand with your feet shoulder-width apart.  Lower the object slowly while you tighten the muscles in your legs, abdomen, and buttocks. Keep the object as close to the center of your body as possible.  Keep your shoulders pulled back, your chin tucked in, and your back straight.  Bend at your  knees. Squat down, but keep your heels off the floor.  Use handles or lifting straps if they are available. Twisting and Reaching  Avoid lifting heavy objects above your waist.  Do not twist at your waist while you are lifting or carrying a load. If you need to turn, move your feet.  Do not bend over without bending at your knees.  Avoid reaching over your head, across a table, or for an object on a high surface. WHAT ARE SOME OTHER TIPS?  Avoid wet floors and icy ground. Keep sidewalks clear of ice to prevent falls.  Do not sleep on a mattress that is too soft or too hard.  Keep items that are used frequently within easy reach.  Put heavier objects on shelves at waist level, and put lighter objects on lower or higher shelves.  Find ways to decrease your stress, such as exercise, massage, or relaxation techniques. Stress can build up in your muscles. Tense muscles are more vulnerable to injury.  Talk with your health care provider if you feel anxious or depressed. These conditions can make back pain worse.  Wear flat heel shoes with cushioned soles.  Avoid sudden movements.  Use both shoulder straps when carrying a backpack.  Do not use any tobacco products, including cigarettes, chewing tobacco, or electronic cigarettes. If you need help quitting, ask your health care provider.   This information is not intended to replace advice given to you by your health care provider. Make sure you discuss any questions you have with your health care provider.   Document Released: 10/16/2004 Document Revised: 01/23/2015 Document Reviewed: 09/12/2014 Elsevier Interactive Patient Education 2016 [place] [employer] is a great source of information about community services available.  Access by dialing 2-1-1 from anywhere in [place], or by website -  Custodian[employer]ly.fi.   Other Local Resources (Updated  09/2015)  [place]    Phone Number and Address  [place] medical care - 1st and 3rd Saturday of every month  Must not qualify for public or private insurance and must have limited income [phone] [address], [place] Department of Social Services  Child care  Emergency assistance for housing and utilities  Food stamps  Medicaid [phone] [address], [place] [address]   [facility] Department  Low-cost medical care for children, communicable diseases, sexually-transmitted diseases, immunizations, maternity care, womens health and family planning [phone] [address], [place] [address]  [facility]   Medication assistance for [facility] residents  Must meet income requirements [phone] [place], [place].    [place]  Child care  Emergency assistance for housing and [employer]  Medicaid [phone] [address] [place], [place] [address]  [facility] [place]   Low-cost medical care,   Monday through Friday, 9 am to 6 pm.   Accepts Medicare/Medicaid, and self-pay [phone] [address] [place], [place] [address]  [facility] for [place] care - Monday through Friday, 8:30 am - 5:30 pm  Accepts Medicaid and self-pay [phone] [address], [place], [place] [address]   [facility]  Primary medical care, including for those with sickle cell disease  Accepts Medicare, Medicaid, insurance and self-pay [phone] [address], [place]  [facility]   Primary medical care  Accepts Medicare, [place], insurance and self-pay [phone] [address] [place], [place] [address]    [facility] Department of Social Services  Child care  Emergency assistance for housing and [employer]  Medicaid [phone] [address] [place], [place] [address]  [place] Department of Health and [employer]  Child care  Emergency assistance for housing and [employer]  Medicaid [phone] [place], [place] [address]   [facility] Medication Assistance Program  Medication assistance for [facility] residents with no insurance only  Must have a primary care doctor [phone] [address], [place], [place]  [facility]   Primary medical care  [place], [place], insurance  [phone] [address] [place], [place]  [facility]   Medication assistance [phone]  [facility]   Primary medical care  Accepts Medicare, [place], insurance and self-pay [phone] [address], [place] [address]  [facility]   Primary medical care  Accepts Medicare, [place], insurance and self-pay [phone] [address], [place] [address]  [facility]  For [place] residents between the ages of 2 and 56 who do not have any form of health insurance, Medicare, [place], or [place] benefits.  Services are provided free of charge to uninsured patients who fall within federal poverty guidelines.    Hours: Tuesdays and Thursdays, 4:15 - 8 pm [phone] [address] [place], [place] [address]  [facility]     Primary medical care  Dental care  Nutritional counseling  Pharmacy  Accepts Medicaid, Medicare, most insurance.  Fees are adjusted based on ability to pay.   [place], [place] [address],  [place], [facility], [place], [facility] [place], [place]  [facility]  Womens health and family planning [phone] [place]. [place], [address] [place]  Child care  Emergency assistance for housing and utilities  Food stamps  Medicaid [phone] [address], [place], [place] [address]   [facility]    Ages 38 and older  Hours: Mondays and Thursdays, 7:00 am - 9:00 am Patients are seen on a first come, first served basis. [phone], ext. [place], [place]  Child care  Emergency assistance for housing and [employer]  Medicaid [phone] [address], [place] [address]  [facility] [place]  Medication assistance  Rental assistance  Food pantry  Medication assistance  Housing assistance  Emergency food distribution  Utility assistance [place], [place]  [place]. [place], [place] [address] Hours: Tuesdays and Thursdays from 9am - 12 noon by appointment only  [place], [place] [address]  [facility] private insurance, [place], and [place].  Payment is based on a sliding scale for those without insurance.  Hours: Mondays, Tuesdays and Thursdays, 8:30 am - 5:30 pm.   [phone] [place], [place]  [facility] - Family Medicine at [facility], [place], and [place].  Payment is based on a sliding scale for those without insurance. [phone] [address], [place]  [facility] - Pediatrics at [address]  Scientist, research (medical), [employer], and [place].  Payment is based on a sliding scale for those without insurance [phone] [address], [employer], [place]  [facility] - Pediatrics at [employer], [place], and [place].  Payment is based on a sliding scale for those without insurance. [phone] [place], [place]  [facility] - Pediatrics at [facility] [facility], [place], and [place].  Payment is based on a sliding scale for those without insurance. [phone], ext. [phone] [place], [place].    [place] care.  Accepts Medicaid and self-pay. [place], [place]

## 2016-05-23 ENCOUNTER — Encounter (HOSPITAL_COMMUNITY): Payer: Self-pay | Admitting: Emergency Medicine

## 2016-05-23 ENCOUNTER — Emergency Department (HOSPITAL_COMMUNITY)
Admission: EM | Admit: 2016-05-23 | Discharge: 2016-05-23 | Disposition: A | Discharge status: Home / Self Care | Payer: Medicaid Other | Attending: Emergency Medicine | Admitting: Emergency Medicine

## 2016-05-23 DIAGNOSIS — F17210 Nicotine dependence, cigarettes, uncomplicated: Secondary | ICD-10-CM | POA: Diagnosis not present

## 2016-05-23 DIAGNOSIS — M545 Low back pain: Secondary | ICD-10-CM | POA: Insufficient documentation

## 2016-05-23 DIAGNOSIS — Z79899 Other long term (current) drug therapy: Secondary | ICD-10-CM | POA: Diagnosis not present

## 2016-05-23 MED ORDER — OXYCODONE-ACETAMINOPHEN 5-325 MG PO TABS
1.0000 | ORAL_TABLET | Freq: Once | ORAL | Status: AC
  Administered 2016-05-23: 1 via ORAL
  Filled 2016-05-23: qty 1

## 2016-05-23 NOTE — Discharge Instructions (Signed)
Please read attached information. If you experience any new or worsening signs or symptoms please return to the emergency room for evaluation. Please follow-up with your primary care provider or specialist as discussed. Please use medication prescribed only as directed and discontinue taking if you have any concerning signs or symptoms.   °

## 2016-05-23 NOTE — ED Provider Notes (Signed)
[facility]-EMERGENCY DEPT Provider Note   CSN: [id number] Arrival date & time: 05/23/16  1606  By signing my name below, I, Modena JanskyAlbert Thayil, attest that this documentation has been prepared under the direction and in the presence of non-physician practitioner, Eyvonne MechanicJeffrey Gilbert Narain, PA-C. Electronically Signed: Modena JanskyAlbert Thayil, Scribe. 05/23/2016. 4:27 PM.  History   Chief Complaint Chief Complaint  Patient presents with  . Back Pain   IThe history is provided by the patient. No language interpreter was used.    HPI Comments: Sarah Nash is a 35 y.o. female with a hx of back pain who presents to the Emergency Department complaining of constant moderate lower back pain that started a week ago. She reports no known injury. [facility], but needs treatment until then. She describes the pain as unrelieved by ibuprofen and relieved by hydrocodone. She denies any fever, numbness/tingilng, weakness, or other complaints.   Past Medical History:  Diagnosis Date  . Abnormal Pap smear 2006  . Chlamydia april 2013  . Complication of anesthesia    nausea and vomiting with c sections no problem with gallbladder surg  . Depression    denies  . GERD (gastroesophageal reflux disease)    hx  . Obesity   . PONV (postoperative nausea and vomiting)   . Pregnancy induced hypertension    "they are watching my b/p"    Patient Active Problem List   Diagnosis Date Noted  . Benign essential hypertension antepartum 03/01/2012  . Unspecified high-risk pregnancy 03/01/2012  . Previous cesarean delivery, antepartum condition or complication 03/01/2012  . Obesity in pregnancy, antepartum 03/01/2012    Past Surgical History:  Procedure Laterality Date  . CESAREAN SECTION  [id number]  . CESAREAN SECTION  06/22/2012   Procedure: CESAREAN SECTION;  Surgeon: Reva Boresanya S Pratt, MD;  Location: [facility] ORS;  Service: Obstetrics;  Laterality: N/A;  . CHOLECYSTECTOMY  07/14/2012   Procedure: LAPAROSCOPIC CHOLECYSTECTOMY;  Surgeon: Robyne AskewPaul S Toth III, MD;  Location: [facility] ORS;  Service: General;  Laterality: N/A;  . TOOTH EXTRACTION N/A 05/25/2015   Procedure: WISDOM TEETH EXTRACTIONS;  Surgeon: Ocie DoyneScott Jensen, DDS;  Location: [facility] OR;  Service: Oral Surgery;  Laterality: N/A;    OB History    Gravida Para Term Preterm AB Living   3 3 3     3    SAB TAB Ectopic Multiple Live Births           3       Home Medications    Prior to Admission medications   Medication Sig Start Date End Date Taking? Authorizing Provider  ferrous sulfate 325 (65 FE) MG tablet Take 325 mg by mouth daily.    Historical Provider, MD  HYDROcodone-acetaminophen (NORCO/VICODIN) 5-325 MG tablet Take 1 tablet by mouth every 4 (four) hours as needed for moderate pain or severe pain. 02/23/16   Trixie DredgeEmily West, PA-C  meloxicam (MOBIC) 7.5 MG tablet Take 1 tablet (7.5 mg total) by mouth daily. 02/23/16   Trixie DredgeEmily West, PA-C    Family History Family History  Problem Relation Age of Onset  . Hypertension Father   . Mental illness Father   . Anesthesia problems Neg Hx     Social History Social History  Substance Use Topics  . Smoking status: Current Some Day Smoker    Types: Cigarettes  . Smokeless tobacco: Never Used     Comment: occ cigarette  . Alcohol use No     Allergies  Bee venom   Review of Systems Review of Systems  All other systems reviewed and are negative.  A complete 10 system review of systems was obtained and all systems are negative except as noted in the HPI and PMH.   Physical Exam Updated Vital Signs BP 155/100 (BP Location: Left Arm)   Pulse 88   Temp 98.8 F (37.1 C) (Oral)   Resp 18   LMP 05/20/2016 (Approximate)   SpO2 100%   Physical Exam  Constitutional: She is oriented to person, place, and time. She appears well-developed and well-nourished. No distress.  HENT:  Head: Normocephalic.  Neck: Normal range of motion. Neck supple.  Pulmonary/Chest: Effort normal.    Musculoskeletal: Normal range of motion. She exhibits tenderness. She exhibits no edema.  TTP of the back diffusely.   No obvious signs of trauma, deformity, infection, step-offs. Lung expansion normal. No scoliosis or kyphosis. Bilateral lower extremity strength 5 out of 5, sensation grossly intact, pedal pulses 2+.  Straight leg negative   Neurological: She is alert and oriented to person, place, and time.  Skin: Skin is warm and dry. She is not diaphoretic.  Psychiatric: She has a normal mood and affect. Her behavior is normal. Judgment and thought content normal.  Nursing note and vitals reviewed.   ED Treatments / Results  DIAGNOSTIC STUDIES: Oxygen Saturation is 100% on RA, normal by my interpretation.    COORDINATION OF CARE: 4:31 PM- Pt advised of plan for treatment and pt agrees.  Labs (all labs ordered are listed, but only abnormal results are displayed) Labs Reviewed - No data to display  EKG  EKG Interpretation None       Radiology No results found.  Procedures Procedures (including critical care time)  Medications Ordered in ED Medications  oxyCODONE-acetaminophen (PERCOCET/ROXICET) 5-325 MG per tablet 1 tablet (not administered)     Initial Impression / Assessment and Plan / ED Course  I have reviewed the triage vital signs and the nursing notes.  Pertinent labs & imaging results that were available during my care of the patient were reviewed by me and considered in my medical decision making (see chart for details).  Clinical Course    Labs:   Imaging:   Consults:   Therapeutics:   Discharge Meds:  Assessment/Plan:   35 year old female presents today with uncomplicated back pain. Patient has history of chronic ongoing back pain with numerous flares. Patient has no red flags for back pain, reports improvement with narcotics last night. Patient will be given a dose of Percocet here in the ED, instructed to use ibuprofen and Tylenol, back  exercises, ice, follow up with orthopedics for reevaluation further management. Patient has a full bowel movement with her primary care provider on Thursday, she is instructed to maintain this appointment but still follow up with orthopedics. Cautions, she verbalized understanding and agreement to today's plan had no further questions concerns at time of discharge.   Final Clinical Impressions(s) / ED Diagnoses   Final diagnoses:  Low back pain without sciatica, unspecified back pain laterality    New Prescriptions New Prescriptions   No medications on file   I personally performed the services described in this documentation, which was scribed in my presence. The recorded information has been reviewed and is accurate.     Eyvonne Mechanic, PA-C 05/23/16 1653    Eyvonne Mechanic, PA-C 05/23/16 1655    Pricilla Loveless, MD 05/24/16 [phone]

## 2016-05-23 NOTE — ED Triage Notes (Signed)
Patient presents for lower back pain x1.5 weeks. Denies known injury, no loss of bladder or bower, no numbness or tingling. A&O x4.

## 2017-02-05 ENCOUNTER — Emergency Department (HOSPITAL_COMMUNITY)
Admission: EM | Admit: 2017-02-05 | Discharge: 2017-02-05 | Disposition: A | Discharge status: Home / Self Care | Payer: Medicaid Other | Attending: Emergency Medicine | Admitting: Emergency Medicine

## 2017-02-05 ENCOUNTER — Encounter (HOSPITAL_COMMUNITY): Payer: Self-pay | Admitting: Emergency Medicine

## 2017-02-05 DIAGNOSIS — J029 Acute pharyngitis, unspecified: Secondary | ICD-10-CM

## 2017-02-05 DIAGNOSIS — Z79899 Other long term (current) drug therapy: Secondary | ICD-10-CM | POA: Diagnosis not present

## 2017-02-05 DIAGNOSIS — F17210 Nicotine dependence, cigarettes, uncomplicated: Secondary | ICD-10-CM | POA: Insufficient documentation

## 2017-02-05 DIAGNOSIS — R112 Nausea with vomiting, unspecified: Secondary | ICD-10-CM | POA: Diagnosis not present

## 2017-02-05 DIAGNOSIS — J039 Acute tonsillitis, unspecified: Secondary | ICD-10-CM

## 2017-02-05 DIAGNOSIS — I10 Essential (primary) hypertension: Secondary | ICD-10-CM | POA: Diagnosis not present

## 2017-02-05 DIAGNOSIS — H6123 Impacted cerumen, bilateral: Secondary | ICD-10-CM

## 2017-02-05 LAB — RAPID STREP SCREEN (MED CTR MEBANE ONLY): Streptococcus, Group A Screen (Direct): NEGATIVE

## 2017-02-05 MED ORDER — CEPHALEXIN 500 MG PO CAPS: 500.0000 mg | ORAL_CAPSULE | Freq: Two times a day (BID) | ORAL | 0 refills | Status: AC

## 2017-02-05 MED ORDER — IBUPROFEN 600 MG PO TABS: 600.0000 mg | ORAL_TABLET | Freq: Three times a day (TID) | ORAL | 0 refills | Status: AC | PRN

## 2017-02-05 MED ORDER — ONDANSETRON 8 MG PO TBDP
8.0000 mg | ORAL_TABLET | Freq: Once | ORAL | Status: AC
  Administered 2017-02-05: 8 mg via ORAL
  Filled 2017-02-05: qty 1

## 2017-02-05 MED ORDER — ONDANSETRON 8 MG PO TBDP
8.0000 mg | ORAL_TABLET | Freq: Three times a day (TID) | ORAL | 0 refills | Status: AC | PRN
Start: 2017-02-05 — End: ?

## 2017-02-05 NOTE — ED Provider Notes (Signed)
[facility]-EMERGENCY DEPT Provider Note    By signing my name below, I, Earmon Phoenix, attest that this documentation has been prepared under the direction and in the presence of [address], [employer]. Electronically Signed: Earmon Phoenix, ED Scribe. 02/05/17. 2:34 PM.    History   Chief Complaint Chief Complaint  Patient presents with  . Sore Throat   The history is provided by the patient and medical records. No language interpreter was used.  Sore Throat  This is a new problem. The current episode started 2 days ago. The problem occurs constantly. The problem has not changed since onset.Pertinent negatives include no chest pain, no abdominal pain and no shortness of breath. The symptoms are aggravated by swallowing. The symptoms are relieved by NSAIDs. Treatments tried: advil. The treatment provided mild relief.   HPI: Sarah Nash is a morbidly obese 36 y.o. female with a PMHx of HTN, who presents to the Emergency Department complaining of a sore throat that began two days ago. She reports associated nausea/vomiting yesterday but has resolved however she hasn't tried to eat/drink anything today; states emesis was nonbloody and nonbilious, and only once. She reports generalized body aches, right ear pain, clear rhinorrhea, and chills. She has taken an Amoxicillin pill that was left over from a prior rx, and Motrin for her symptoms with minimal relief. There are no modifying factors noted. She denies hematochezia, melena, hematemesis, rash, fever, drooling, trismus, ear discharge, difficulty swallowing, cough, SOB, CP, abdominal pain, constipation, diarrhea, dysuria, hematuria, numbness, tingling, focal weakness, or any other complaints at this time. She denies cigarette smoking. States her symptoms feel like prior strep throat episodes. No known sick contacts.    Past Medical History:  Diagnosis Date  . Abnormal Pap smear 2006  . Chlamydia april 2013  . Complication of anesthesia    nausea and vomiting with c sections no problem with gallbladder surg  . Depression    denies  . GERD (gastroesophageal reflux disease)    hx  . Obesity   . PONV (postoperative nausea and vomiting)   . Pregnancy induced hypertension    "they are watching my b/p"    Patient Active Problem List   Diagnosis Date Noted  . Benign essential hypertension antepartum 03/01/2012  . Unspecified high-risk pregnancy 03/01/2012  . Previous cesarean delivery, antepartum condition or complication 03/01/2012  . Obesity in pregnancy, antepartum 03/01/2012    Past Surgical History:  Procedure Laterality Date  . CESAREAN SECTION  [id number]  . CESAREAN SECTION  06/22/2012   Procedure: CESAREAN SECTION;  Surgeon: Reva Bores, MD;  Location: [facility];  Service: Obstetrics;  Laterality: N/A;  . CHOLECYSTECTOMY  07/14/2012   Procedure: LAPAROSCOPIC CHOLECYSTECTOMY;  Surgeon: Robyne Askew, MD;  Location: [facility];  Service: General;  Laterality: N/A;  . TOOTH EXTRACTION N/A 05/25/2015   Procedure: WISDOM TEETH EXTRACTIONS;  Surgeon: Ocie Doyne, DDS;  Location: [facility];  Service: Oral Surgery;  Laterality: N/A;    OB History    Gravida Para Term Preterm AB Living   3 3 3     3    SAB TAB Ectopic Multiple Live Births           3       Home Medications    Prior to Admission medications   Medication Sig Start Date End Date Taking? Authorizing Provider  ferrous sulfate 325 (65 FE) MG tablet Take 325 mg by mouth daily.    [provider]  HYDROcodone-acetaminophen (NORCO/VICODIN) 5-325  MG tablet Take 1 tablet by mouth every 4 (four) hours as needed for moderate pain or severe pain. 02/23/16   Trixie DredgeWest, Emily, PA-C  meloxicam (MOBIC) 7.5 MG tablet Take 1 tablet (7.5 mg total) by mouth daily. 02/23/16   Trixie DredgeWest, Emily, PA-C    Family History Family History  Problem Relation Age of Onset  . Hypertension Father   . Mental illness Father   . Anesthesia problems Neg Hx     Social History Social History    Substance Use Topics  . Smoking status: Current Some Day Smoker    Types: Cigarettes  . Smokeless tobacco: Never Used     Comment: occ cigarette  . Alcohol use No     Allergies   Bee venom   Review of Systems Review of Systems  Constitutional: Positive for chills. Negative for fever.  HENT: Positive for ear pain, rhinorrhea and sore throat. Negative for drooling, ear discharge and trouble swallowing.   Respiratory: Negative for cough and shortness of breath.   Cardiovascular: Negative for chest pain.  Gastrointestinal: Positive for nausea and vomiting. Negative for abdominal pain, constipation and diarrhea.  Genitourinary: Negative for dysuria and hematuria.  Musculoskeletal: Positive for myalgias. Negative for arthralgias.  Skin: Negative for color change and rash.  Allergic/Immunologic: Negative for immunocompromised state.  Neurological: Negative for weakness and numbness.  Psychiatric/Behavioral: Negative for confusion.   All other systems reviewed and are negative for acute change except as noted in the HPI.   Physical Exam Updated Vital Signs BP (!) 175/109 (BP Location: Right Wrist)   Pulse 83   Temp 98.2 F (36.8 C) (Oral)   Resp 18   Ht 5\' 11"  (1.803 m)   Wt (!) 430 lb (195 kg)   SpO2 97%   BMI 59.97 kg/m   Physical Exam  Constitutional: She is oriented to person, place, and time. Vital signs are normal. She appears well-developed and well-nourished.  Non-toxic appearance. No distress.  HTN noted similar to previous visits.  HENT:  Head: Normocephalic and atraumatic.  Right Ear: Hearing, external ear and ear canal normal.  Left Ear: Hearing, external ear and ear canal normal.  Nose: Mucosal edema present.  Mouth/Throat: Uvula is midline and mucous membranes are normal. No trismus in the jaw. No uvula swelling. Oropharyngeal exudate, posterior oropharyngeal edema and posterior oropharyngeal erythema present. No tonsillar abscesses. Tonsils are 3+ on the  right. Tonsils are 3+ on the left. Tonsillar exudate.  Ears with bilateral cerumen impaction but otherwise clear bilaterally. Nose mildly congested. Oropharynx injected, without uvular swelling or deviation, no trismus or drooling, 3+ tonsillar swelling bilaterally with erythema, +exudates. No PTA.  Eyes: Conjunctivae and EOM are normal. Right eye exhibits no discharge. Left eye exhibits no discharge.  Neck: Normal range of motion. Neck supple.  Cardiovascular: Normal rate, regular rhythm, normal heart sounds and intact distal pulses.  Exam reveals no gallop and no friction rub.   No murmur heard. Pulmonary/Chest: Effort normal and breath sounds normal. No respiratory distress. She has no decreased breath sounds. She has no wheezes. She has no rhonchi. She has no rales.  Abdominal: Soft. Normal appearance and bowel sounds are normal. She exhibits no distension. There is no tenderness. There is no rigidity, no rebound, no guarding, no CVA tenderness, no tenderness at McBurney's point and negative Murphy's sign.  Soft, NTND, +BS throughout, no r/g/r, neg murphy's, neg mcburney's, no CVA TTP  Musculoskeletal: Normal range of motion.  Lymphadenopathy:  Head (right side): Tonsillar adenopathy present.       Head (left side): Tonsillar adenopathy present.    She has cervical adenopathy.  Shotty cervical and bilateral tonsillar LAD which was mildly TTP.  Neurological: She is alert and oriented to person, place, and time. She has normal strength. No sensory deficit.  Skin: Skin is warm, dry and intact. No rash noted.  Psychiatric: She has a normal mood and affect. Her behavior is normal.  Nursing note and vitals reviewed.    ED Treatments / Results  DIAGNOSTIC STUDIES: Oxygen Saturation is 97% on RA, normal by my interpretation.   COORDINATION OF CARE: 2:16 PM- Will order Zofran and PO challenge. Will wait for strep test to result. Pt verbalizes understanding and agrees to  plan.  Medications  ondansetron (ZOFRAN-ODT) disintegrating tablet 8 mg (8 mg Oral Given 02/05/17 1422)    Labs (all labs ordered are listed, but only abnormal results are displayed) Labs Reviewed  RAPID STREP SCREEN (NOT AT [facility])  CULTURE, GROUP A STREP [facility])    EKG  EKG Interpretation None       Radiology No results found.  Procedures Procedures (including critical care time)  Medications Ordered in ED Medications  ondansetron (ZOFRAN-ODT) disintegrating tablet 8 mg (8 mg Oral Given 02/05/17 1422)     Initial Impression / Assessment and Plan / ED Course  I have reviewed the triage vital signs and the nursing notes.  Pertinent labs & imaging results that were available during my care of the patient were reviewed by me and considered in my medical decision making (see chart for details).     36 y.o. female here with sore throat x2 days, states it feels like prior strep throat episodes. Also reports n/v yesterday, none today but hasn't eaten anything or tried to consume PO intake. On exam, bilateral tonsillar swelling 3+, symmetric, no uvular deviation or evidence of PTA, +exudates, handling secretions well; b/l cerumen impaction. Tonsillar and cervical LAD bilaterally. Afebrile. No abdominal tenderness. RST in process, clinically it appears she may have strep but CENTOR score moderate so will see what RST says; may still empirically treat. Will give zofran ODT and PO challenge, doubt need for further emergent work up as n/v likely from tonsillitis. Will reassess shortly.   2:58 PM RST neg, however given clinical appearance, will empirically cover for strep/bacterial etiologies however advised that it could be viral process and would just take time to improve on its own. Pt tolerating PO well, will send home with zofran rx as well. Discussed OTC remedies for symptomatic relief. Of note, HTN noted similar to prior ED visits as well, discussed that it could just be white coat  HTN vs pain-related, doubt need further emergent work up however advised DASH diet and f/up with PCP regarding this finding. F/up with PCP in 1wk for recheck. I explained the diagnosis and have given explicit precautions to return to the ER including for any other new or worsening symptoms. The patient understands and accepts the medical plan as it's been dictated and I have answered their questions. Discharge instructions concerning home care and prescriptions have been given. The patient is STABLE and is discharged to home in good condition.   I personally performed the services described in this documentation, which was scribed in my presence. The recorded information has been reviewed and is accurate.    Final Clinical Impressions(s) / ED Diagnoses   Final diagnoses:  Sore throat  Tonsillitis  Hypertension, unspecified type  Nausea and vomiting in adult patient  Bilateral impacted cerumen    New Prescriptions New Prescriptions   CEPHALEXIN (KEFLEX) 500 MG CAPSULE    Take 1 capsule (500 mg total) by mouth 2 (two) times daily.   ONDANSETRON (ZOFRAN ODT) 8 MG DISINTEGRATING TABLET    Take 1 tablet (8 mg total) by mouth every 8 (eight) hours as needed for nausea or vomiting.     [address] [place], [place] 02/05/17 1458    Charlynne Pander, MD 02/05/17 [phone]

## 2017-02-05 NOTE — ED Triage Notes (Signed)
Patient c/o sore throat for past 2 days.

## 2017-02-05 NOTE — Discharge Instructions (Signed)
Use zofran as directed as needed for nausea. Continue to stay well-hydrated. Gargle warm salt water and spit it out. Use chloraseptic spray as needed for sore throat. Continue to alternate between Tylenol and Ibuprofen for pain or fever. Take antibiotic as directed until completed, but keep in mind that this still may be a viral illness and if so then the antibiotic will not change the course of the illness but the viral illness will resolve on its own with time. Use Mucinex for cough suppression/expectoration of mucus. Use netipot and flonase to help with nasal congestion. May consider over-the-counter Benadryl or other antihistamine to decrease secretions and for help with your symptoms. Your blood pressure has been high on multiple ER visits. Eat a low salt diet and follow up with your doctor regarding this finding. Follow up with your primary care doctor in 5-7 days for recheck of ongoing symptoms. Return to emergency department for emergent changing or worsening of symptoms.

## 2017-02-08 LAB — CULTURE, GROUP A STREP (THRC)

## 2017-07-13 ENCOUNTER — Ambulatory Visit
Admission: RE | Admit: 2017-07-13 | Discharge: 2017-07-13 | Disposition: A | Discharge status: Home / Self Care | Payer: Medicaid Other | Source: Ambulatory Visit | Attending: Family Medicine | Admitting: Family Medicine

## 2017-07-13 ENCOUNTER — Other Ambulatory Visit: Payer: Self-pay | Admitting: Family Medicine

## 2017-07-13 DIAGNOSIS — M545 Low back pain: Secondary | ICD-10-CM

## 2019-10-14 IMAGING — CR DG LUMBAR SPINE COMPLETE 4+V
6 series · 6 of 6 positions shown · non-contrast
Comparison: 02/23/2016

CLINICAL DATA: Low back pain

EXAM:
LUMBAR SPINE - COMPLETE 4+ VIEW

[w lumbar spine ap (1 of 2)]
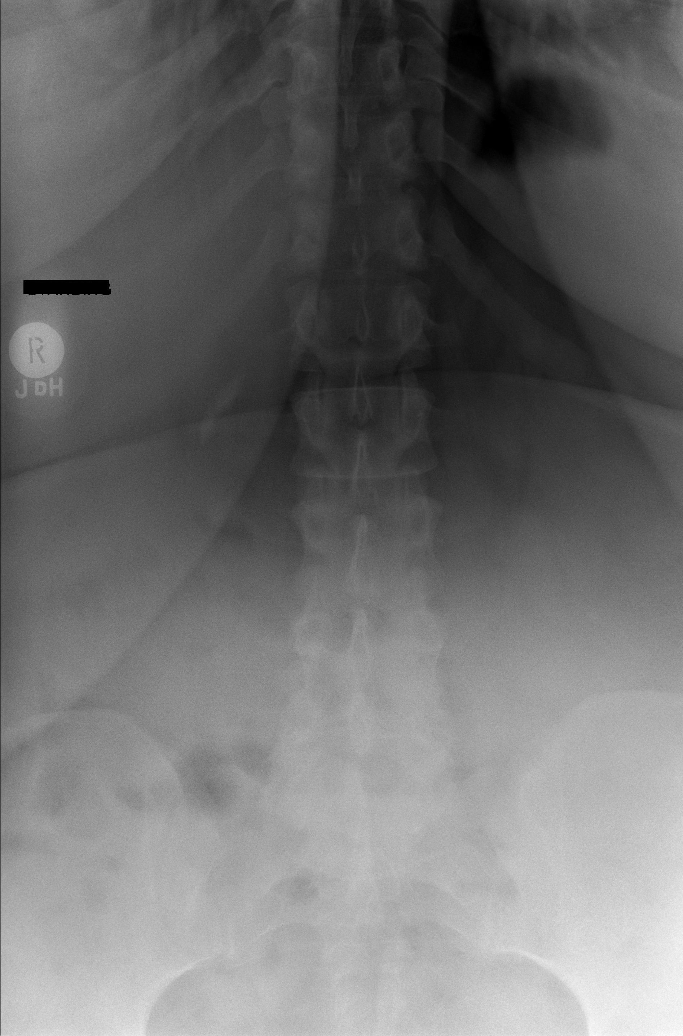

[w lumbar spine ap (2 of 2)]
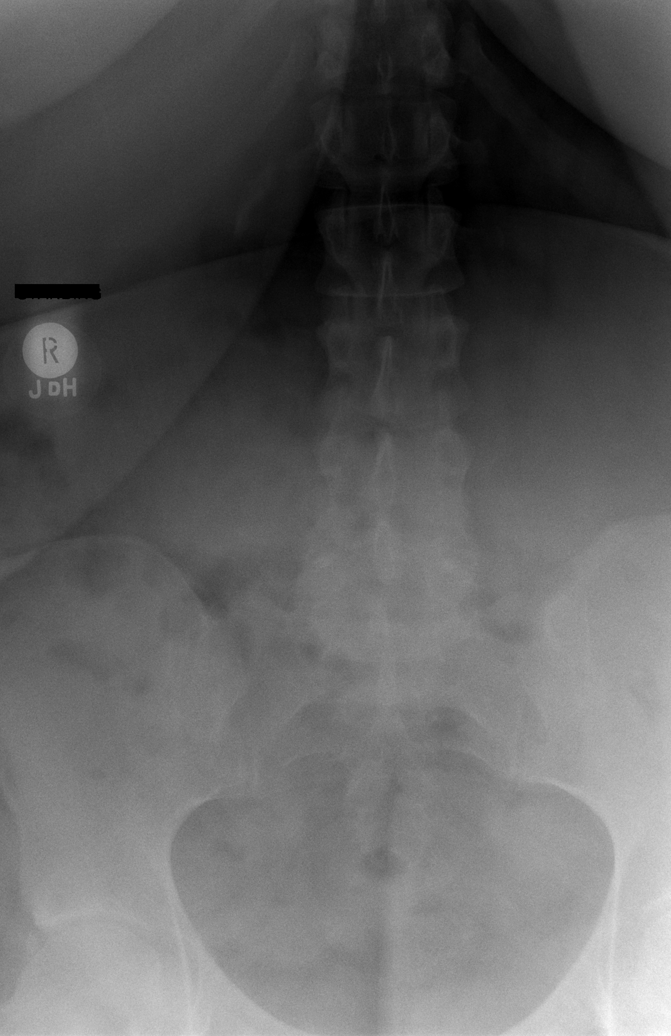

[w lumbar spine obl (1 of 2)]
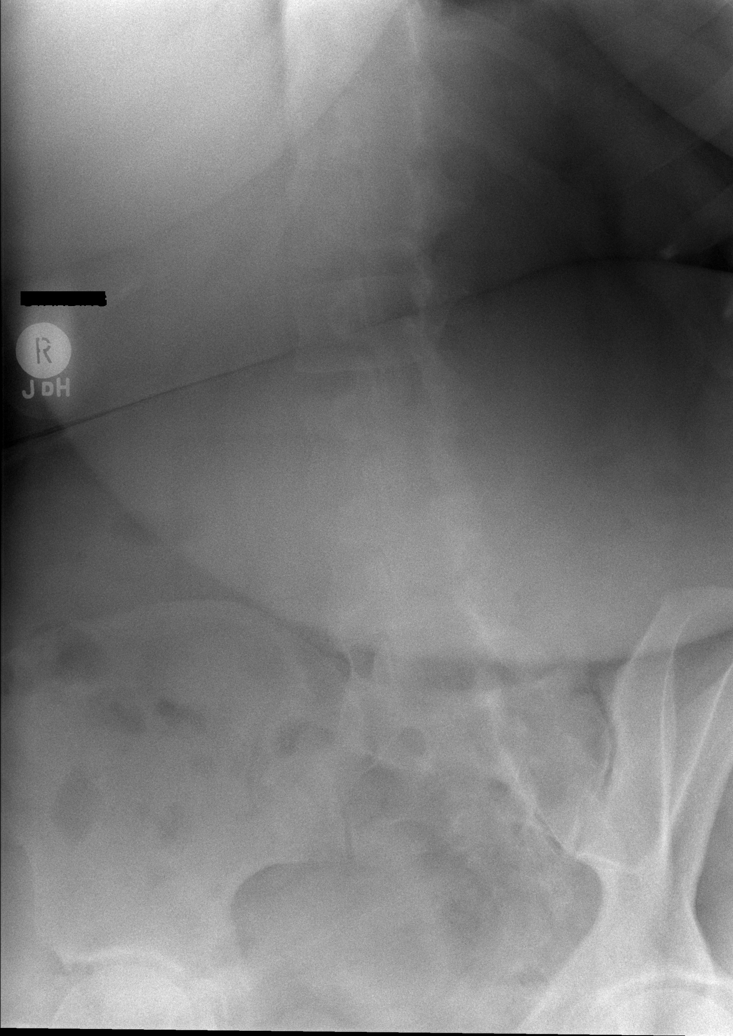

[w lumbar spine obl (2 of 2)]
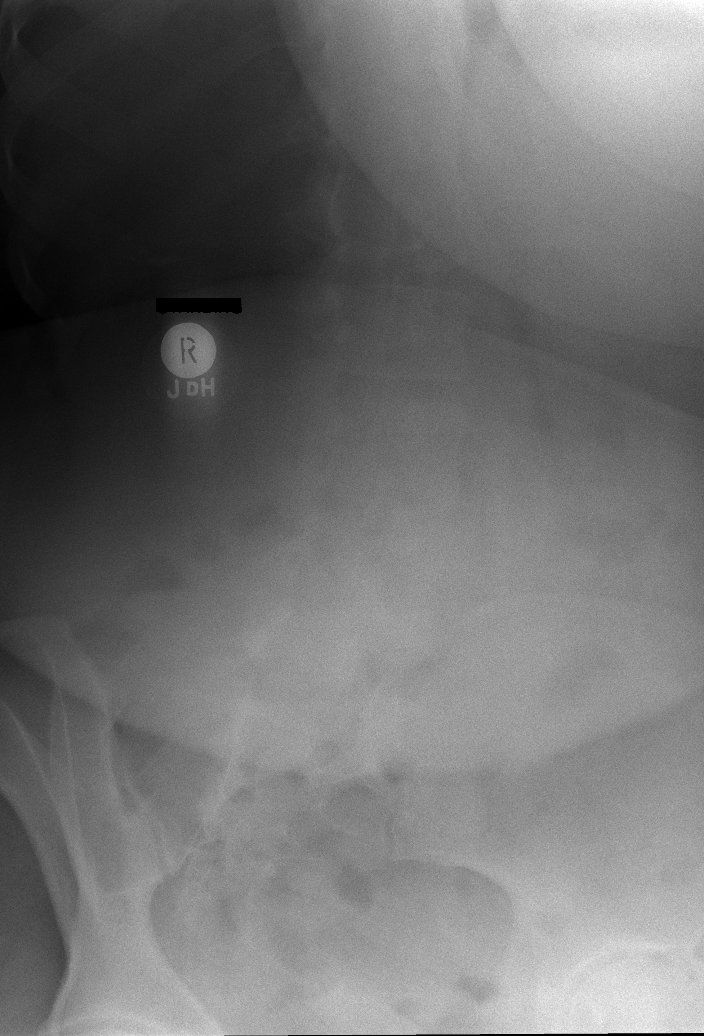

[w lumbar spine lat]
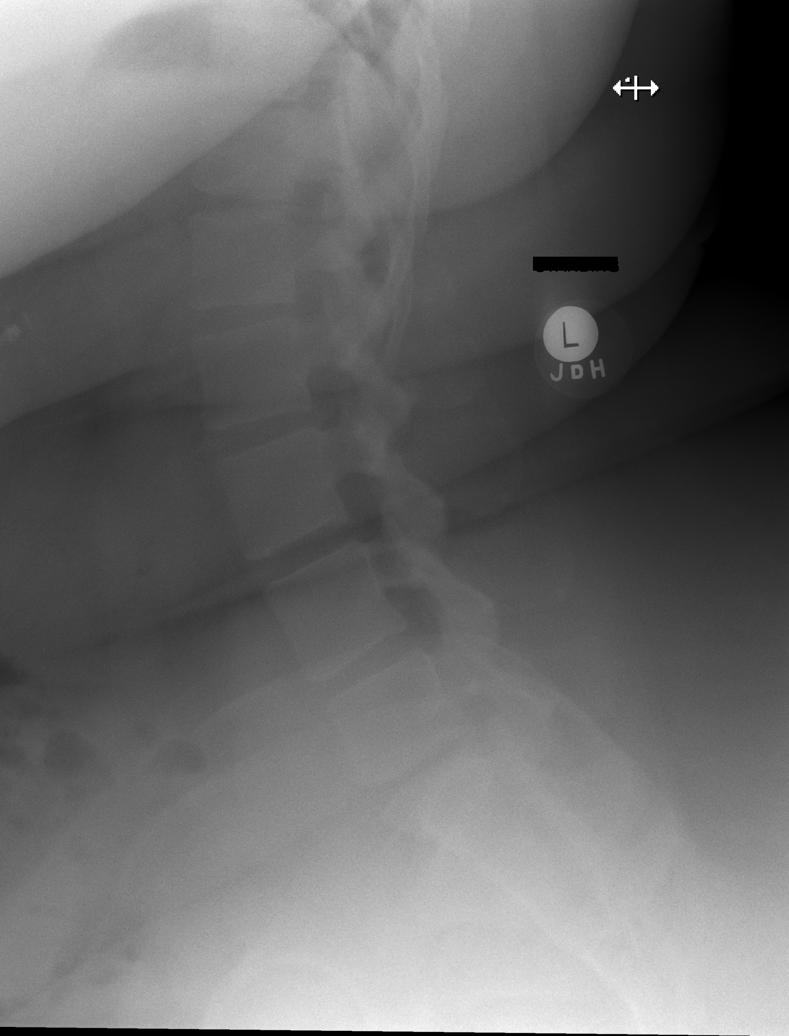

[w lumbar l-5 s-1 spot]
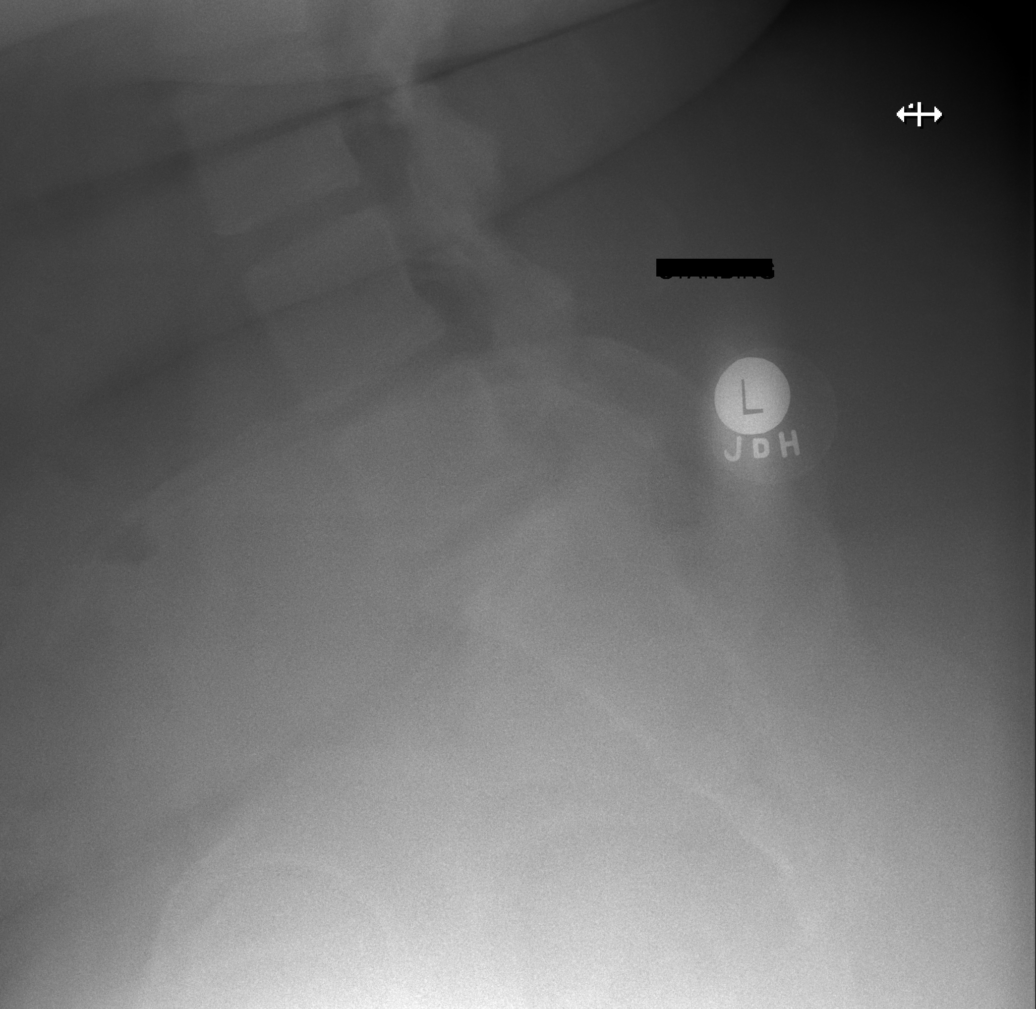

[6 of 6 positions shown; findings below may reference images not displayed]

FINDINGS: Mild degenerative changes/disc space narrowing at L4-L5 and L5-S1.
Vertebral body heights appear normal. Disc spaces are otherwise
preserved.
IMPRESSION: 1. Mild degenerative disc changes at L4-L5 and L5-S1.
2. No acute osseous abnormality.

## 2021-08-05 ENCOUNTER — Encounter (HOSPITAL_COMMUNITY): Payer: Self-pay

## 2021-08-05 ENCOUNTER — Emergency Department (HOSPITAL_COMMUNITY)
Admission: EM | Admit: 2021-08-05 | Discharge: 2021-08-05 | Disposition: A | Discharge status: Home / Self Care | Payer: Medicaid Other | Attending: Emergency Medicine | Admitting: Emergency Medicine

## 2021-08-05 DIAGNOSIS — F17210 Nicotine dependence, cigarettes, uncomplicated: Secondary | ICD-10-CM | POA: Insufficient documentation

## 2021-08-05 DIAGNOSIS — R519 Headache, unspecified: Secondary | ICD-10-CM | POA: Diagnosis not present

## 2021-08-05 DIAGNOSIS — G8929 Other chronic pain: Secondary | ICD-10-CM | POA: Diagnosis not present

## 2021-08-05 MED ORDER — KETOROLAC TROMETHAMINE 15 MG/ML IJ SOLN: 15.0000 mg | Freq: Once | INTRAMUSCULAR | Status: DC

## 2021-08-05 MED ORDER — KETOROLAC TROMETHAMINE 30 MG/ML IJ SOLN
15.0000 mg | Freq: Once | INTRAMUSCULAR | Status: AC
  Administered 2021-08-05: 15 mg via INTRAMUSCULAR
  Filled 2021-08-05: qty 1

## 2021-08-05 NOTE — Discharge Instructions (Signed)
Your exam was reassuring today.  Your headache could be secondary to your high blood pressure.  Please call your primary care provider to have your blood pressure medication resumed.  If you develop chest pain, shortness of breath, visual changes, balance issues please return to the emergency room.  Continue taking Tylenol and Motrin for your headache.  You did receive a shot of Toradol in the emergency room.Marland Kitchen

## 2021-08-05 NOTE — ED Provider Notes (Signed)
[facility]-EMERGENCY DEPT Provider Note   CSN: [id number] Arrival date & time: 08/05/21  1358     History Chief Complaint  Patient presents with   Headache    Sarah Nash is a 40 y.o. female.  40 year old female presents today for evaluation of headache that has been going on for weeks.  She reports the headache is located in the posterior head.  She reports history of headaches.  She denies any visual changes, feeling of being off balance, fever, or lightheadedness.  He is without chest pain, or shortness of breath.  She has been taking Tylenol and Motrin with relief.  She reports she has been noncompliant with her blood pressure medication weeks at this point.  Her blood pressure is elevated today.  The history is provided by the patient. No language interpreter was used.      Past Medical History:  Diagnosis Date   Abnormal Pap smear 2006   Chlamydia april 2013   Complication of anesthesia    nausea and vomiting with c sections no problem with gallbladder surg   Depression    denies   GERD (gastroesophageal reflux disease)    hx   Obesity    PONV (postoperative nausea and vomiting)    Pregnancy induced hypertension    "they are watching my b/p"    Patient Active Problem List   Diagnosis Date Noted   Benign essential hypertension antepartum 03/01/2012   Unspecified high-risk pregnancy 03/01/2012   Previous cesarean delivery, antepartum condition or complication 03/01/2012   Obesity in pregnancy, antepartum 03/01/2012    Past Surgical History:  Procedure Laterality Date   CESAREAN SECTION  2003,2009   CESAREAN SECTION  06/22/2012   Procedure: CESAREAN SECTION;  Surgeon: Reva Bores, MD;  Location: [facility];  Service: Obstetrics;  Laterality: N/A;   CHOLECYSTECTOMY  07/14/2012   Procedure: LAPAROSCOPIC CHOLECYSTECTOMY;  Surgeon: Robyne Askew, MD;  Location: [facility];  Service: General;  Laterality: N/A;   TOOTH EXTRACTION N/A 05/25/2015    Procedure: WISDOM TEETH EXTRACTIONS;  Surgeon: Ocie Doyne, DDS;  Location: [facility] OR;  Service: Oral Surgery;  Laterality: N/A;     OB History     Gravida  3   Para  3   Term  3   Preterm      AB      Living  3      SAB      IAB      Ectopic      Multiple      Live Births  3           Family History  Problem Relation Age of Onset   Hypertension Father    Mental illness Father    Anesthesia problems Neg Hx     Social History   Tobacco Use   Smoking status: Some Days    Types: Cigarettes   Smokeless tobacco: Never   Tobacco comments:    occ cigarette  Substance Use Topics   Alcohol use: No   Drug use: Yes    Types: Marijuana    Comment: occ    Home Medications Prior to Admission medications   Medication Sig Start Date End Date Taking? Authorizing Provider  ferrous sulfate 325 (65 FE) MG tablet Take 325 mg by mouth daily.    [provider]  HYDROcodone-acetaminophen (NORCO/VICODIN) 5-325 MG tablet Take 1 tablet by mouth every 4 (four) hours as needed for moderate pain or severe pain.  02/23/16   Clayton Bibles, PA-C  ibuprofen (ADVIL,MOTRIN) 600 MG tablet Take 1 tablet (600 mg total) by mouth every 8 (eight) hours as needed for fever, headache, moderate pain or cramping. 02/05/17   Street, [place], PA-C  meloxicam (MOBIC) 7.5 MG tablet Take 1 tablet (7.5 mg total) by mouth daily. 02/23/16   Clayton Bibles, PA-C  ondansetron (ZOFRAN ODT) 8 MG disintegrating tablet Take 1 tablet (8 mg total) by mouth every 8 (eight) hours as needed for nausea or vomiting. 02/05/17   Street, [place], PA-C    Allergies    Bee venom  Review of Systems   Review of Systems  Constitutional:  Negative for activity change, appetite change, chills and fever.  HENT:  Negative for congestion.   Eyes:  Negative for photophobia and visual disturbance.  Respiratory:  Negative for cough and shortness of breath.   Gastrointestinal:  Negative for abdominal pain, nausea and vomiting.   Neurological:  Positive for headaches. Negative for weakness and light-headedness.  All other systems reviewed and are negative.  Physical Exam Updated Vital Signs BP (!) 158/112 (BP Location: Left Arm)   Pulse 78   Temp 98.1 F (36.7 C) (Oral)   Resp 20   SpO2 100%   Physical Exam Vitals and nursing note reviewed.  Constitutional:      General: She is not in acute distress.    Appearance: Normal appearance. She is not ill-appearing.  HENT:     Head: Normocephalic and atraumatic.     Nose: Nose normal.  Eyes:     General: No visual field deficit.    Conjunctiva/sclera: Conjunctivae normal.  Cardiovascular:     Rate and Rhythm: Normal rate and regular rhythm.  Pulmonary:     Effort: Pulmonary effort is normal. No respiratory distress.  Abdominal:     General: There is no distension.     Tenderness: There is no abdominal tenderness. There is no guarding.  Musculoskeletal:        General: No deformity.  Skin:    Findings: No rash.  Neurological:     Mental Status: She is alert.     GCS: GCS eye subscore is 4. GCS verbal subscore is 5. GCS motor subscore is 6.     Cranial Nerves: Cranial nerves 2-12 are intact. No cranial nerve deficit or dysarthria.     Sensory: Sensation is intact.     Motor: Motor function is intact.     Gait: Gait is intact.    ED Results / Procedures / Treatments   Labs (all labs ordered are listed, but only abnormal results are displayed) Labs Reviewed - No data to display  EKG None  Radiology No results found.  Procedures Procedures   Medications Ordered in ED Medications - No data to display  ED Course  I have reviewed the triage vital signs and the nursing notes.  Pertinent labs & imaging results that were available during my care of the patient were reviewed by me and considered in my medical decision making (see chart for details).    MDM Rules/Calculators/A&P                           40 year old female presents today  for evaluation of intermittent chronic headache.  She has no signs of neurological deficits, or signs of hypertensive emergency.  She is without chest pain, shortness of breath change in vision, or confusion.  Will give shot of Toradol.  Discussed  follow-up with PCP to restart her antihypertensive.  Her blood pressure in the emergency room was 150s/110.  No indication to start antihypertensive from the emergency room today.  Return precautions discussed.  Patient voiced understanding and is in agreement with plan.  Final Clinical Impression(s) / ED Diagnoses Final diagnoses:  None    Rx / DC Orders ED Discharge Orders     None        Evlyn Courier, PA-C 08/05/21 1623    Lajean Saver, MD 08/05/21 1640

## 2021-08-05 NOTE — ED Triage Notes (Signed)
Pt arrived via POV, c/o intermittent headache and light sensitivity as well as fatigue.

## 2022-12-05 ENCOUNTER — Ambulatory Visit
Admission: EM | Admit: 2022-12-05 | Discharge: 2022-12-05 | Disposition: A | Discharge status: Home / Self Care | Payer: Medicaid Other

## 2022-12-05 DIAGNOSIS — K047 Periapical abscess without sinus: Secondary | ICD-10-CM | POA: Diagnosis not present

## 2022-12-05 MED ORDER — HYDROCODONE-ACETAMINOPHEN 5-325 MG PO TABS: 1.0000 | ORAL_TABLET | Freq: Four times a day (QID) | ORAL | 0 refills | Status: AC | PRN

## 2022-12-05 MED ORDER — AMOXICILLIN-POT CLAVULANATE 875-125 MG PO TABS: 1.0000 | ORAL_TABLET | Freq: Two times a day (BID) | ORAL | 0 refills | Status: AC

## 2022-12-05 MED ORDER — FLUCONAZOLE 150 MG PO TABS: 150.0000 mg | ORAL_TABLET | ORAL | 0 refills | Status: AC

## 2022-12-05 NOTE — Discharge Instructions (Addendum)
Start taking Augmentin for the suspected dental infection.  Use Tylenol for regular pain control.  If Tylenol is not helping at a dose of 650 mg every 6 hours then go ahead and use hydrocodone once every 6 hours.  Once her pain is controlled switch back to Tylenol.  If you do not need hydrocodone then do not use it.  It is a narcotic pain medicine and can cause a lot of significant side effects including addiction.  Make sure you schedule an appointment with a dentist/dental surgeon as soon as possible.  You may try some of the resources below.     [facility] in [place], [place] Address: [address], [place], [place] [address] Phone: [phone]  [place] [phone] extension [phone] [address].  Dr. Donn Pierini [phone] [place] [phone] [address] [facility] [place].  [facility] [phone] extension [phone] N. [address] [place], [place], [address] First come first serve for the first 10 clients.  May do simple extractions only, no wisdom teeth or surgery.  You may try the second for Thursday of the month starting at [place] [facility] [employer] You may call the school to see if they are still helping to provide dental care for emergent cases.

## 2022-12-05 NOTE — ED Provider Notes (Signed)
[facility]  Note:  This document was prepared using Systems analyst and may include unintentional dictation errors.  MRN: [id number] DOB: Apr 01, 1981  Subjective:   Sarah Nash is a 42 y.o. female presenting for 1 day history of left-sided facial pain, facial swelling, dental pain.  ~2 months ago, her feeling premolar came out.  She has not followed up with a dentist.  It was not giving her any issues until the past day.  No fever, nausea, vomiting, chest pain.  No current facility-administered medications for this encounter.  Current Outpatient Medications:    amLODipine (NORVASC) 5 MG tablet, Take 5 mg by mouth daily., Disp: , Rfl:    ferrous sulfate 325 (65 FE) MG tablet, Take 325 mg by mouth daily., Disp: , Rfl:    HYDROcodone-acetaminophen (NORCO/VICODIN) 5-325 MG tablet, Take 1 tablet by mouth every 4 (four) hours as needed for moderate pain or severe pain., Disp: 15 tablet, Rfl: 0   ibuprofen (ADVIL,MOTRIN) 600 MG tablet, Take 1 tablet (600 mg total) by mouth every 8 (eight) hours as needed for fever, headache, moderate pain or cramping., Disp: 30 tablet, Rfl: 0   meloxicam (MOBIC) 7.5 MG tablet, Take 1 tablet (7.5 mg total) by mouth daily., Disp: 14 tablet, Rfl: 0   ondansetron (ZOFRAN ODT) 8 MG disintegrating tablet, Take 1 tablet (8 mg total) by mouth every 8 (eight) hours as needed for nausea or vomiting., Disp: 10 tablet, Rfl: 0   Allergies  Allergen Reactions   Bee Venom Hives, Itching, Swelling and Other (See Comments)    *Swelling at site and swelling of face *bruising     Past Medical History:  Diagnosis Date   Abnormal Pap smear 2006   Chlamydia april 2013   Complication of anesthesia    nausea and vomiting with c sections no problem with gallbladder surg   Depression    denies   GERD (gastroesophageal reflux disease)    hx   Obesity    PONV (postoperative nausea and vomiting)    Pregnancy induced hypertension     "they are watching my b/p"     Past Surgical History:  Procedure Laterality Date   CESAREAN SECTION  2003,2009   CESAREAN SECTION  06/22/2012   Procedure: CESAREAN SECTION;  Surgeon: Donnamae Jude, MD;  Location: [facility];  Service: Obstetrics;  Laterality: N/A;   CHOLECYSTECTOMY  07/14/2012   Procedure: LAPAROSCOPIC CHOLECYSTECTOMY;  Surgeon: Merrie Roof, MD;  Location: [facility];  Service: General;  Laterality: N/A;   TOOTH EXTRACTION N/A 05/25/2015   Procedure: WISDOM TEETH EXTRACTIONS;  Surgeon: Diona Browner, DDS;  Location: [facility];  Service: Oral Surgery;  Laterality: N/A;    Family History  Problem Relation Age of Onset   Hypertension Father    Mental illness Father    Anesthesia problems Neg Hx     Social History   Tobacco Use   Smoking status: Former    Types: Cigarettes   Smokeless tobacco: Never   Tobacco comments:    occ cigarette  Vaping Use   Vaping Use: Never used  Substance Use Topics   Alcohol use: No   Drug use: Yes    Types: Marijuana    Comment: occ    ROS   Objective:   Vitals: BP (!) 179/109 (BP Location: Right Arm)   Pulse 95   Temp 99.6 F (37.6 C) (Oral)   Resp 20   LMP 11/05/2022   SpO2  98%   Physical Exam Constitutional:      General: She is not in acute distress.    Appearance: Normal appearance. She is well-developed. She is not ill-appearing, toxic-appearing or diaphoretic.  HENT:     Head: Normocephalic and atraumatic.      Nose: Nose normal.     Mouth/Throat:     Mouth: Mucous membranes are moist.   Eyes:     General: No scleral icterus.       Right eye: No discharge.        Left eye: No discharge.     Extraocular Movements: Extraocular movements intact.  Cardiovascular:     Rate and Rhythm: Normal rate.  Pulmonary:     Effort: Pulmonary effort is normal.  Skin:    General: Skin is warm and dry.  Neurological:     General: No focal deficit present.     Mental Status: She is alert and oriented to person, place, and  time.  Psychiatric:        Mood and Affect: Mood normal.        Behavior: Behavior normal.     Assessment and Plan :   PDMP not reviewed this encounter.  1. Dental infection     Start Augmentin for dental infection/abscess, use Tylenol for regular pain control, hydrocodone for breakthrough pain.  Emphasized need for dental surgeon consult. Counseled patient on potential for adverse effects with medications prescribed/recommended today, strict ER and return-to-clinic precautions discussed, patient verbalized understanding.    Jaynee Eagles, [place] 12/05/22 [id number]

## 2022-12-05 NOTE — ED Triage Notes (Addendum)
Pt states left upper filling came out "couple months" ago-intermittent pain-woke this am with swelling to left side of face-NAD-steady gait

## 2023-12-31 ENCOUNTER — Emergency Department (HOSPITAL_BASED_OUTPATIENT_CLINIC_OR_DEPARTMENT_OTHER)
Admission: EM | Admit: 2023-12-31 | Discharge: 2023-12-31 | Disposition: A | Discharge status: Home / Self Care | Attending: Emergency Medicine | Admitting: Emergency Medicine

## 2023-12-31 ENCOUNTER — Encounter (HOSPITAL_BASED_OUTPATIENT_CLINIC_OR_DEPARTMENT_OTHER): Payer: Self-pay

## 2023-12-31 ENCOUNTER — Other Ambulatory Visit: Payer: Self-pay

## 2023-12-31 ENCOUNTER — Emergency Department (HOSPITAL_BASED_OUTPATIENT_CLINIC_OR_DEPARTMENT_OTHER)

## 2023-12-31 DIAGNOSIS — I10 Essential (primary) hypertension: Secondary | ICD-10-CM | POA: Diagnosis not present

## 2023-12-31 DIAGNOSIS — D72829 Elevated white blood cell count, unspecified: Secondary | ICD-10-CM | POA: Diagnosis not present

## 2023-12-31 DIAGNOSIS — R1012 Left upper quadrant pain: Secondary | ICD-10-CM | POA: Diagnosis present

## 2023-12-31 LAB — COMPREHENSIVE METABOLIC PANEL WITH GFR
ALT: 12 U/L (ref 0–44)
AST: 15 U/L (ref 15–41)
Albumin: 3.3 g/dL — ABNORMAL LOW (ref 3.5–5.0)
Alkaline Phosphatase: 71 U/L (ref 38–126)
Anion gap: 8 (ref 5–15)
BUN: 9 mg/dL (ref 6–20)
CO2: 23 mmol/L (ref 22–32)
Calcium: 9 mg/dL (ref 8.9–10.3)
Chloride: 106 mmol/L (ref 98–111)
Creatinine, Ser: 0.74 mg/dL (ref 0.44–1.00)
GFR, Estimated: >60 mL/min (ref >60)
Glucose, Bld: 175 mg/dL — ABNORMAL HIGH (ref 70–99)
Potassium: 3.9 mmol/L (ref 3.5–5.1)
Sodium: 137 mmol/L (ref 135–145)
Total Bilirubin: 0.4 mg/dL (ref 0.0–1.2)
Total Protein: 8.2 g/dL — ABNORMAL HIGH (ref 6.5–8.1)

## 2023-12-31 LAB — URINALYSIS, ROUTINE W REFLEX MICROSCOPIC
Bilirubin Urine: NEGATIVE
Glucose, UA: NEGATIVE mg/dL
Hgb urine dipstick: NEGATIVE
Ketones, ur: NEGATIVE mg/dL
Leukocytes,Ua: NEGATIVE
Nitrite: NEGATIVE
Protein, ur: 30 mg/dL — AB
Specific Gravity, Urine: 1.025 (ref 1.005–1.030)
pH: 7 (ref 5.0–8.0)

## 2023-12-31 LAB — CBC
HCT: 38.9 % (ref 36.0–46.0)
Hemoglobin: 11.3 g/dL — ABNORMAL LOW (ref 12.0–15.0)
MCH: 23.3 pg — ABNORMAL LOW (ref 26.0–34.0)
MCHC: 29 g/dL — ABNORMAL LOW (ref 30.0–36.0)
MCV: 80.2 fL (ref 80.0–100.0)
Platelets: 472 10*3/uL — ABNORMAL HIGH (ref 150–400)
RBC: 4.85 MIL/uL (ref 3.87–5.11)
RDW: 15.3 % (ref 11.5–15.5)
WBC: 14.9 10*3/uL — ABNORMAL HIGH (ref 4.0–10.5)
nRBC: 0 % (ref 0.0–0.2)

## 2023-12-31 LAB — URINALYSIS, MICROSCOPIC (REFLEX)

## 2023-12-31 LAB — LIPASE, BLOOD: Lipase: 27 U/L (ref 11–51)

## 2023-12-31 LAB — PREGNANCY, URINE: Preg Test, Ur: NEGATIVE

## 2023-12-31 MED ORDER — ONDANSETRON HCL 4 MG/2ML IJ SOLN
4.0000 mg | Freq: Once | INTRAMUSCULAR | Status: AC
  Administered 2023-12-31: 4 mg via INTRAVENOUS
  Filled 2023-12-31: qty 2

## 2023-12-31 MED ORDER — SODIUM CHLORIDE 0.9 % IV BOLUS
1000.0000 mL | Freq: Once | INTRAVENOUS | Status: AC
  Administered 2023-12-31: 1000 mL via INTRAVENOUS

## 2023-12-31 MED ORDER — MORPHINE SULFATE (PF) 4 MG/ML IV SOLN
4.0000 mg | Freq: Once | INTRAVENOUS | Status: AC
  Administered 2023-12-31: 4 mg via INTRAVENOUS
  Filled 2023-12-31: qty 1

## 2023-12-31 MED ORDER — IOHEXOL 300 MG/ML  SOLN
125.0000 mL | Freq: Once | INTRAMUSCULAR | Status: AC | PRN
  Administered 2023-12-31: 125 mL via INTRAVENOUS

## 2023-12-31 NOTE — ED Provider Notes (Signed)
 [facility] Provider Note   CSN: [id number] Arrival date & time: 12/31/23  0028     History  Chief Complaint  Patient presents with   Abdominal Pain    Sarah Nash is a 43 y.o. female.  Patient is a 43 year old female with history of prior cholecystectomy and C-section x 3.  Patient presenting today with complaints of left upper quadrant pain.  This has been present for the past 3 days.  She describes a constant pain to the left upper quadrant that is worse when she moves.  She denies any nausea, vomiting, or diarrhea.  No constipation.  No fevers or chills.  No alleviating factors.       Home Medications Prior to Admission medications   Medication Sig Start Date End Date Taking? Authorizing Provider  amLODipine (NORVASC) 5 MG tablet Take 5 mg by mouth daily. 11/13/22   [provider]  amoxicillin-clavulanate (AUGMENTIN) 875-125 MG tablet Take 1 tablet by mouth 2 (two) times daily. 12/05/22   Wallis Bamberg, PA-C  ferrous sulfate 325 (65 FE) MG tablet Take 325 mg by mouth daily.    [provider]  fluconazole (DIFLUCAN) 150 MG tablet Take 1 tablet (150 mg total) by mouth every 3 (three) days. 12/05/22   Wallis Bamberg, PA-C  HYDROcodone-acetaminophen (NORCO/VICODIN) 5-325 MG tablet Take 1 tablet by mouth every 6 (six) hours as needed for severe pain. 12/05/22   Wallis Bamberg, PA-C  ibuprofen (ADVIL,MOTRIN) 600 MG tablet Take 1 tablet (600 mg total) by mouth every 8 (eight) hours as needed for fever, headache, moderate pain or cramping. 02/05/17   Street, [place], PA-C  meloxicam (MOBIC) 7.5 MG tablet Take 1 tablet (7.5 mg total) by mouth daily. 02/23/16   Trixie Dredge, PA-C  ondansetron (ZOFRAN ODT) 8 MG disintegrating tablet Take 1 tablet (8 mg total) by mouth every 8 (eight) hours as needed for nausea or vomiting. 02/05/17   Street, [place], PA-C      Allergies    Bee venom    Review of Systems   Review of Systems  All  other systems reviewed and are negative.   Physical Exam Updated Vital Signs BP (!) 158/103   Pulse 91   Temp 97.7 F (36.5 C)   Resp 20   LMP 12/17/2023 (Approximate)   SpO2 98%  Physical Exam Vitals and nursing note reviewed.  Constitutional:      General: She is not in acute distress.    Appearance: She is well-developed. She is not diaphoretic.  HENT:     Head: Normocephalic and atraumatic.  Cardiovascular:     Rate and Rhythm: Normal rate and regular rhythm.     Heart sounds: No murmur heard.    No friction rub. No gallop.  Pulmonary:     Effort: Pulmonary effort is normal. No respiratory distress.     Breath sounds: Normal breath sounds. No wheezing.  Abdominal:     General: Bowel sounds are normal. There is no distension.     Palpations: Abdomen is soft.     Tenderness: There is abdominal tenderness in the left upper quadrant. There is no right CVA tenderness, left CVA tenderness, guarding or rebound.  Musculoskeletal:        General: Normal range of motion.     Cervical back: Normal range of motion and neck supple.  Skin:    General: Skin is warm and dry.  Neurological:     General: No focal deficit  present.     Mental Status: She is alert and oriented to person, place, and time.     ED Results / Procedures / Treatments   Labs (all labs ordered are listed, but only abnormal results are displayed) Labs Reviewed  COMPREHENSIVE METABOLIC PANEL WITH GFR - Abnormal; Notable for the following components:      Result Value   Glucose, Bld 175 (*)    Total Protein 8.2 (*)    Albumin 3.3 (*)    All other components within normal limits  CBC - Abnormal; Notable for the following components:   WBC 14.9 (*)    Hemoglobin 11.3 (*)    MCH 23.3 (*)    MCHC 29.0 (*)    Platelets 472 (*)    All other components within normal limits  LIPASE, BLOOD  URINALYSIS, ROUTINE W REFLEX MICROSCOPIC  PREGNANCY, URINE    EKG None  Radiology No results  found.  Procedures Procedures    Medications Ordered in ED Medications  sodium chloride 0.9 % bolus 1,000 mL (has no administration in time range)  morphine (PF) 4 MG/ML injection 4 mg (has no administration in time range)  ondansetron (ZOFRAN) injection 4 mg (has no administration in time range)    ED Course/ Medical Decision Making/ A&P  Patient is a 43 year old female presenting with complaints of left upper quadrant pain as described in the HPI.  Patient arrives with stable vital signs and is afebrile.  Physical examination reveals left upper quadrant tenderness, but is otherwise unremarkable.  Laboratory studies obtained including CBC, CMP, lipase, urinalysis, and pregnancy test.  She has a leukocytosis with white count of 14.9, but laboratory studies are basically unremarkable otherwise.  Urinalysis is clear and pregnancy test is negative.  CT scan of the abdomen pelvis showing no acute process.  Patient given IV fluids along with morphine for pain and Zofran for nausea and seems to be feeling better.  Because of her discomfort is unclear, but nothing appears emergent.  Patient to be discharged with ibuprofen, rest, and follow-up as needed.  Final Clinical Impression(s) / ED Diagnoses Final diagnoses:  None    Rx / DC Orders ED Discharge Orders     None         Geoffery Lyons, MD 12/31/23 [phone]

## 2023-12-31 NOTE — ED Triage Notes (Signed)
 Pt. Reports having LUQ pain for 3 days. Denies N/V/D Hx of HTN hasn't taken meds in 2 wks

## 2023-12-31 NOTE — Discharge Instructions (Signed)
 Begin taking ibuprofen 600 mg every 6 hours as needed for pain.  Return to the ER if you develop worsening pain, high fevers, bloody stools, or for other new and concerning symptoms.

## 2023-12-31 NOTE — ED Notes (Signed)
 Patient transported to CT
